# Patient Record
Sex: Male | Born: 1940 | Race: White | Hispanic: No | Marital: Married | Smoking: Never smoker
Health system: Southern US, Community
[De-identification: ages and names within clinical notes are randomized; demographics above are authoritative.]

## PROBLEM LIST (undated history)

## (undated) DIAGNOSIS — J302 Other seasonal allergic rhinitis: Secondary | ICD-10-CM

## (undated) DIAGNOSIS — K429 Umbilical hernia without obstruction or gangrene: Secondary | ICD-10-CM

## (undated) DIAGNOSIS — N433 Hydrocele, unspecified: Secondary | ICD-10-CM

## (undated) DIAGNOSIS — F32A Depression, unspecified: Secondary | ICD-10-CM

## (undated) DIAGNOSIS — F329 Major depressive disorder, single episode, unspecified: Secondary | ICD-10-CM

## (undated) DIAGNOSIS — R7303 Prediabetes: Secondary | ICD-10-CM

## (undated) HISTORY — DX: Major depressive disorder, single episode, unspecified: F32.9

## (undated) HISTORY — DX: Depression, unspecified: F32.A

## (undated) HISTORY — DX: Hydrocele, unspecified: N43.3

## (undated) HISTORY — PX: EYE SURGERY: SHX253

---

## 1997-10-18 ENCOUNTER — Ambulatory Visit (HOSPITAL_COMMUNITY): Admission: RE | Admit: 1997-10-18 | Discharge: 1997-10-18 | Payer: Self-pay | Admitting: Internal Medicine

## 1998-02-09 ENCOUNTER — Emergency Department (HOSPITAL_COMMUNITY): Admission: EM | Admit: 1998-02-09 | Discharge: 1998-02-09 | Payer: Self-pay | Admitting: Emergency Medicine

## 1998-02-10 ENCOUNTER — Ambulatory Visit (HOSPITAL_COMMUNITY): Admission: RE | Admit: 1998-02-10 | Discharge: 1998-02-10 | Payer: Self-pay | Admitting: Internal Medicine

## 1998-02-24 ENCOUNTER — Ambulatory Visit (HOSPITAL_COMMUNITY): Admission: RE | Admit: 1998-02-24 | Discharge: 1998-02-24 | Payer: Self-pay | Admitting: Internal Medicine

## 1998-02-24 ENCOUNTER — Encounter: Payer: Self-pay | Admitting: Internal Medicine

## 1998-06-05 ENCOUNTER — Ambulatory Visit: Admission: RE | Admit: 1998-06-05 | Discharge: 1998-06-05 | Payer: Self-pay | Admitting: *Deleted

## 1999-06-12 ENCOUNTER — Ambulatory Visit (HOSPITAL_COMMUNITY): Admission: RE | Admit: 1999-06-12 | Discharge: 1999-06-12 | Payer: Self-pay

## 2000-05-20 ENCOUNTER — Encounter: Payer: Self-pay | Admitting: Emergency Medicine

## 2000-05-20 ENCOUNTER — Inpatient Hospital Stay (HOSPITAL_COMMUNITY): Admission: EM | Admit: 2000-05-20 | Discharge: 2000-05-21 | Payer: Self-pay | Admitting: Emergency Medicine

## 2000-05-21 ENCOUNTER — Encounter: Payer: Self-pay | Admitting: Cardiovascular Disease

## 2002-03-05 ENCOUNTER — Encounter: Payer: Self-pay | Admitting: Emergency Medicine

## 2002-03-05 ENCOUNTER — Emergency Department (HOSPITAL_COMMUNITY): Admission: EM | Admit: 2002-03-05 | Discharge: 2002-03-05 | Payer: Self-pay | Admitting: Emergency Medicine

## 2002-08-26 ENCOUNTER — Encounter: Admission: RE | Admit: 2002-08-26 | Discharge: 2002-08-26 | Payer: Self-pay | Admitting: Internal Medicine

## 2002-08-26 ENCOUNTER — Encounter: Payer: Self-pay | Admitting: Internal Medicine

## 2003-04-21 ENCOUNTER — Observation Stay (HOSPITAL_COMMUNITY): Admission: EM | Admit: 2003-04-21 | Discharge: 2003-04-21 | Payer: Self-pay | Admitting: Emergency Medicine

## 2004-05-03 ENCOUNTER — Ambulatory Visit (HOSPITAL_BASED_OUTPATIENT_CLINIC_OR_DEPARTMENT_OTHER): Admission: RE | Admit: 2004-05-03 | Discharge: 2004-05-03 | Payer: Self-pay | Admitting: Orthopedic Surgery

## 2004-05-11 ENCOUNTER — Encounter: Admission: RE | Admit: 2004-05-11 | Discharge: 2004-07-21 | Payer: Self-pay | Admitting: Orthopedic Surgery

## 2005-11-04 ENCOUNTER — Encounter: Admission: RE | Admit: 2005-11-04 | Discharge: 2005-11-04 | Payer: Self-pay | Admitting: Orthopedic Surgery

## 2005-11-14 ENCOUNTER — Encounter: Admission: RE | Admit: 2005-11-14 | Discharge: 2005-11-14 | Payer: Self-pay | Admitting: Orthopedic Surgery

## 2005-12-19 ENCOUNTER — Encounter: Admission: RE | Admit: 2005-12-19 | Discharge: 2005-12-19 | Payer: Self-pay | Admitting: Orthopedic Surgery

## 2006-08-22 ENCOUNTER — Ambulatory Visit (HOSPITAL_COMMUNITY): Admission: RE | Admit: 2006-08-22 | Discharge: 2006-08-22 | Payer: Self-pay | Admitting: *Deleted

## 2006-08-22 ENCOUNTER — Ambulatory Visit: Payer: Self-pay | Admitting: Vascular Surgery

## 2006-08-22 ENCOUNTER — Encounter (INDEPENDENT_AMBULATORY_CARE_PROVIDER_SITE_OTHER): Payer: Self-pay | Admitting: Orthopedic Surgery

## 2008-06-03 ENCOUNTER — Ambulatory Visit: Payer: Self-pay | Admitting: Cardiovascular Disease

## 2008-06-10 ENCOUNTER — Ambulatory Visit: Payer: Self-pay

## 2008-07-09 ENCOUNTER — Ambulatory Visit: Payer: Self-pay

## 2008-07-09 ENCOUNTER — Encounter: Payer: Self-pay | Admitting: Cardiovascular Disease

## 2008-07-21 ENCOUNTER — Telehealth: Payer: Self-pay | Admitting: Cardiovascular Disease

## 2009-02-19 ENCOUNTER — Emergency Department (HOSPITAL_COMMUNITY): Admission: EM | Admit: 2009-02-19 | Discharge: 2009-02-20 | Payer: Self-pay | Admitting: Emergency Medicine

## 2009-04-08 ENCOUNTER — Encounter (INDEPENDENT_AMBULATORY_CARE_PROVIDER_SITE_OTHER): Payer: Self-pay | Admitting: *Deleted

## 2009-11-17 ENCOUNTER — Encounter
Admission: RE | Admit: 2009-11-17 | Discharge: 2009-11-17 | Payer: Self-pay | Source: Home / Self Care | Admitting: Orthopaedic Surgery

## 2010-04-16 ENCOUNTER — Encounter: Payer: Self-pay | Admitting: Cardiovascular Disease

## 2010-04-25 NOTE — Letter (Signed)
Summary: Referral - not able to see patient  [facility]  [address] [place], [place] [address]   Phone: [phone]  Fax: [phone]        April 08, 2009   [facility] [address] [place], [place] [address]   Re:   John [name] DOB:  14-Mar-1941 MRN:   [id number]    Dear Dr. Robert Bellow, M.D.:  Thank you for your kind referral of the above patient.  We have attempted to schedule the recommended procedure for a Screening Colonoscopy but have not been able to schedule because:    X  The patient was not available by phone and/or has not returned our calls.  ___ The patient declined to schedule the procedure at this time.  We appreciate the referral and hope that we will have the opportunity to treat this patient in the future.    Sincerely,  [facility] [phone]

## 2010-06-28 LAB — URINALYSIS, ROUTINE W REFLEX MICROSCOPIC
Glucose, UA: 250 mg/dL — AB
Hgb urine dipstick: NEGATIVE
Ketones, ur: NEGATIVE mg/dL
Nitrite: NEGATIVE
Protein, ur: NEGATIVE mg/dL
Specific Gravity, Urine: 1.038 — ABNORMAL HIGH (ref 1.005–1.030)
Urobilinogen, UA: 0.2 mg/dL (ref 0.0–1.0)
pH: 5 (ref 5.0–8.0)

## 2010-06-28 LAB — URINE CULTURE
Colony Count: NO GROWTH
Culture: NO GROWTH

## 2010-08-08 NOTE — Assessment & Plan Note (Signed)
[facility]                            CARDIOLOGY OFFICE NOTE   John [name], John [name]                        MRN:          [id number]  DATE:06/03/2008                            DOB:          04/04/1940    A 70 year old patient previously seen here 10 years ago for atypical  pain, had a normal heart cath.  The patient has been having recurrent  chest pain.  It is atypical.  It is in the center of his chest.  It can  radiate to his neck and down his arms.  It is not necessarily  exertional.  Frequently, it is worse after sleeping at night.  He also  has a component which sounds more like carpal tunnel syndrome with pain  and tingling in his hands.  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  His  coronary risk factors include positive family history.  He is a  nonsmoker.  He is not on any medicine for hypertension or diabetes or  cholesterol.  He sees Dr. Clarene Duke for his primary care needs at [facility]   His pain has been progressive over the last 6 weeks.  He is under a lot  of stress in regards to his business.  He cannot walk very well.  He has  had 3 surgeries on his knee.   PAST MEDICAL HISTORY:  Otherwise remarkable for hiatal hernia, normal  heart cath back in 1997, which was performed by myself, history of  reflux.   ALLERGIES:  He is allergic to PENICILLIN.   He is taking an aspirin a day.   He is happily married.  He has 2 older children.  He owns a Medical sales representative.  He is otherwise sedentary.  He does not smoke or drink.   FAMILY HISTORY:  Noncontributory.   PHYSICAL EXAMINATION:  GENERAL:  Remarkable for an overweight male in no  distress.  VITAL SIGNS:  Blood pressure 140/70, pulse 78 and regular, weight 248.  HEENT:  Unremarkable.  NECK:  Carotids are normal without bruit.  No lymphadenopathy,  thyromegaly, or JVP elevation.  LUNGS:  Clear.  Good diaphragmatic motion.  No wheezing.  CARDIAC:  S1, S2.  Normal heart sounds.  PMI  normal.  ABDOMEN:  Benign.  Bowel sounds positive.  No AAA, no tenderness, no  bruit, no hepatosplenomegaly, no hepatojugular reflux, or tenderness.  EXTREMITIES:  Distal pulses are intact.  No edema.  NEURO:  Nonfocal.  SKIN:  Warm and dry.  MUSCULOSKELETAL:  No muscular weakness.  Radial pulses are good.  He  gets some tingling in his hands with a positive Tinel sign only on the  right.  No pain on abduction and multiple upper extremity arm movements.   EKG shows sinus rhythm with first-degree heart block, slightly poor R-  wave progression, nonspecific ST-T-wave changes in leads III and F.   IMPRESSION:  1. Chest pain with nonspecific EKG changes and first-degree heart      block, unable to exercise due to right knee pain.  Followup      adenosine  Myoview.  2. Possible carpal tunnel syndrome.  Encourage the patient to follow      up with Dr. Clarene Duke in regards to possible median nerve testing.      His pain may also be emanating from his neck and he may need a      cervical spine.   I will see him as needed so long as his Myoview is normal.     Theron Arista C. Eden Emms, MD, [facility]  Electronically Signed    PCN/MedQ  DD: 06/03/2008  DT: 06/04/2008  Job #: [id number]

## 2010-08-11 NOTE — H&P (Signed)
NAME:  John [name], John [name]                           ACCOUNT NO.:  [id number]   MEDICAL RECORD NO.:  [id number]                   PATIENT TYPE:  EMS   LOCATION:  MAJO                                 FACILITY:  [facility]   PHYSICIAN:  Duke Salvia, M.D.               DATE OF BIRTH:  August 30, 1940   DATE OF ADMISSION:  04/20/2003  DATE OF DISCHARGE:                                HISTORY & PHYSICAL   John [name] presents to the emergency room with complaints of chest pain.   He is a 70 year old gentleman with a history of chest pain in the past, for  which he had a right catheterization in 1997.  That was negative and he had  a Cardiolite in 2002 or so; that was also negative.  Over the last three  days he has had recurrent episodes of substernal chest discomfort that is  quite distinct from his previous stabbing pains. He describes this as knife-  like with radiation into the neck and into the proximal portion of the left  arm.  It is not associated with nausea or shortness of breath.  There may be  some associated diaphoresis.  He thinks that it lasts minutes.  It is  unrelated to exertion. It seems to stop spontaneously.  He also has noted in  the same time frame a brackish taste in the back of his mouth and is awaken  in the morning with a similar bad taste over the last week or so.   He does not have a history of a hiatal hernia or a known reflux disease. He  does state that his stools have been dark and more malodorous of late.   His cardiac risk factors are notable for dyslipidemia but no family history  of diabetes, hypertension or cigarettes.   He does have a history of sleep apnea.   PAST MEDICAL HISTORY:  Otherwise negative.   PAST SURGICAL HISTORY:  Negative.   SOCIAL HISTORY:  He is married. He has a son. He works as a Nutritional therapist. He does  not use cigarettes, alcohol or recreational drugs.   REVIEW OF SYSTEMS:  Notable for night sweats but this is chronic. It is  unassociated  with changes in weight.  HEENT REVIEW:  Negative.  MUSCULOSKELETAL:  Some arthritis. RESPIRATORY:  Negative.  GI:  Negative.  ENDOCRINE:  Negative. GU: There is some nocturia as well as urgency.   MEDICATIONS:  Include only daily aspirin.   ALLERGIES:  He is allergic to penicillin.   PHYSICAL EXAMINATION:  GENERAL:  John [name] is an older Caucasian male in no  acute distress.  VITAL SIGNS:  Blood pressure 126/74.  Pulse was 76.  HEENT:  No icterus, no xanthomas.  NECK: The neck veins were flat. The carotids were brisk and full  bilaterally. There were no bruits.  BACK: Without kyphosis or scoliosis.  LUNGS:  Clear.  HEART:  The heart sounds were regular without murmurs or gallops.  ABDOMEN:  There is mild epigastric tenderness. This is the same from  previous symptoms.  Abdomen was soft without active bowel sounds without  midline pulsation.  Femoral pulses were 2+ and distal pulses were intact.  EXTREMITIES: There was no clubbing, cyanosis or edema.  NEURO: Grossly normal.  RECTAL: Negative.  SKIN: Warm and dry.   Electrocardiogram was signed by Dr. Freida Busman and is missing and repeat is  pending at this time.   IMPRESSION:  1. Recurrent chest pain of three days duration with typical and atypical     features.  2. Cardiolite that was negative in 2002, with catheterization in 1997.  3. Cardiac risk factors notable for hyperlipidemia, but otherwise negative.  4. Obstructive sleep apnea.  5. Prostatism.   John [name] has recurrent chest pain with typical  and atypical features. His  profile is relatively low; the issue that is going to be difficult, is that  he is having recurrent hospitalizations for this and may well be that  catheterization as a definitive diagnostic tool, might be of some benefit.   I will defer this Drs. Timothy Lasso and Roscoe.   PLAN:  1. Will admit and rule out myocardial infarction.  2. Heparin overnight.  3. Proton pump inhibitor.  4. Fasting lipid profile.   5. May benefit from low risk cath if his enzymes are negative for definitive     diagnosis.                                                Duke Salvia, M.D.    SCK/MEDQ  D:  04/21/2003  T:  04/21/2003  Job:  [id number]

## 2010-08-11 NOTE — Discharge Summary (Signed)
NAMEEDRIK, RUNDLE                           ACCOUNT NO.:  [id number]   MEDICAL RECORD NO.:  [id number]                   PATIENT TYPE:  INP   LOCATION:  6532                                 FACILITY:  [facility]   PHYSICIAN:  Charlton Haws, M.D.                  DATE OF BIRTH:  1940/06/06   DATE OF ADMISSION:  04/20/2003  DATE OF DISCHARGE:  04/21/2003                           DISCHARGE SUMMARY - REFERRING   SUMMARY OF HISTORY:  Mr. Kaufhold is a 70 year old gentleman who presented to  the emergency room with reoccurring episodes of chest discomfort for the  preceding three days.  He feels it very distinct from his prior discomfort  of stabbing pain.  He does describe this discomfort as a knife-like  sensation with radiation into the neck and the proximal portion of the left  arm.  This is not associated with nausea or shortness of breath.  He thinks  there may be some diaphoresis that lasts only minutes and is unrelated to  exertion and resolves spontaneously.  He also noticed some water brash at  the time of discomfort and has had this sensation in the morning when he  awakens in the last week or so.  He has a history of right catheterization  in 1997 and a negative Cardiolite in approximately 2002.  He has a history  of hyperlipidemia.   LABORATORY DATA:  CK, MB and troponin were negative for myocardial  infarction.  H&H was 14.6 and 43.8, normal indices.  Platelets 173, WBCs  5.7.  Sodium 137, potassium 4, BUN 11, creatinine 0.7, glucose 82.  At the  time of this dictation lipid panel was pending.   EKG showed normal sinus rhythm, first degree AV block.   HOSPITAL COURSE:  Mr. Bacha was admitted to 6500 by Dr. Duke Salvia.  Dr. Graciela Husbands did not feel his discomfort was related to cardiac etiology.  He  felt it was more gastrointestinal in nature and started the patient on  Protonix and continued his coated aspirin.  He noted on admission he will  defer outpatient Cardiolite to Dr.  Charlton Haws.  Dr. Graciela Husbands notified Dr.  Gwen Pounds of admission.  However, Dr. Timothy Lasso, on reviewing the patient's  charts at the office, stated that the patient was released from his practice  for medical noncompliance and multiple no-shows.  He feels that he needs to  find a new primary care physician and finds the EKGs were negative for  myocardial infarction, thus Dr. [place] Bing felt that he could be  discharged home with gastrointestinal management and follow-up in the  office.  Dr. Dietrich Pates stated that at the time of follow-up outpatient  Cardiolite should be arranged.   DISCHARGE DIAGNOSES:  1. Atypical chest discomfort, probably gastrointestinal in etiology.  2. History of medical noncompliance as well as noncompliance for     appointments.  3. History of  hyperlipidemia, not currently on medications.   DISPOSITION:  Mr. Walsh is discharged home .  He is given a prescription for  Protonix 40 mg q.d.  He is given a one months supply with one refill in  hopes that he will obtain a primary care physician and be compliant with his  medications and follow-up.  He was also asked to continue a coated aspirin  325 mg q. day.  His activity was not restricted.  He was asked to maintain a  low salt, fat and cholesterol diet and asked to obtain a primary care  physician.  He will see Dr. Reginia Forts PA on February 9 at 12 o'clock  for follow-up.  If the patient appears for  that appointment a consideration of an outpatient stress Cardiolite should  be performed if the Protonix has not helped alleviate his symptoms.  Also at  the time of follow-up, review of the hospital fasting lipid profile should  be reviewed since it is still pending at the time of this dictation.      Joellyn Rued, P.A. [facility]                    Charlton Haws, M.D.    EW/MEDQ  D:  04/21/2003  T:  04/21/2003  Job:  [id number]   cc:   Charlton Haws, M.D.   Gwen Pounds, M.D.  [address]  [place]  [place]  [address]  Fax: [phone]

## 2010-08-11 NOTE — Discharge Summary (Signed)
[facility]  Patient:    John [name], John [name]                        MRN: [id number] Adm. Date:  [id number] Attending:  Colon Branch Dictator:   Joellyn Rued, P.A.C. CC:         Erskine Speed, M.D.   Referring Physician Discharge [facility]  DATE OF BIRTH:  [birth year]  SUMMARY OF HISTORY:  Mr. [name] is a 70 year old who developed stabbing left-sided chest discomfort around 4:30 on the day of admission.  He also noticed some left arm numbness.  He denied any associated nausea, vomiting, or diaphoresis.  The symptoms seemed to be worse with movement and lasted approximately 30 minutes.  He called our office and they told him to go to the emergency room.  Cardiac catheterization in 1997 was reportedly okay by the patient.  His medical history is unremarkable.  LABORATORY DATA:  CKs and troponins were negative for myocardial infarction. The sodium was 138, potassium 3.9, BUN 16, creatinine 0.6, and glucose 107. The ALT was slightly elevated at 54.  The hemoglobin was 15.0, hematocrit 43.8, normal indices, platelets 187, and WBC 5.2.  PT 12.5, PTT 25.  The EKG showed normal sinus rhythm, first degree AV block, and nonspecific ST-T wave changes.  HOSPITAL COURSE:  Overnight he did not have any further symptoms.  There was noted to be a mild increase in SGOT, however, his enzymes and troponins were negative for myocardial infarction.  A Persantine Cardiolite was performed on May 21, 2000.  During the procedure, his heart rate did increase to approximately 100 and he experienced burning in his chest unassociated with EKG changes.  Imaging showed an EF of 47% and no signs of ischemia or scar. There was mild septal hypokinesis.  It was felt that he could be discharged home.  DISCHARGE DIAGNOSIS:  Chest discomfort of unknown etiology.  DISPOSITION:  He is discharged home.  DISCHARGE MEDICATIONS:  He is asked to continue his aspirin 325 mg  q.d.  FOLLOW-UP:  He will follow up with Dr. Theron Arista C. Nishans P.A. in the office and asked to follow up with Erskine Speed, M.D.  Note that he should have fasting lipids done to further assess his risk for coronary artery disease. DD:  05/21/00 TD:  05/21/00 Job: [id number]

## 2010-08-11 NOTE — Op Note (Signed)
[name], [name]                 ACCOUNT NO.:  [id number]   MEDICAL RECORD NO.:  [id number]          PATIENT TYPE:  AMB   LOCATION:  DSC                          FACILITY:  [facility]   PHYSICIAN:  Mila Homer. Sherlean Foot, M.D. DATE OF BIRTH:  May 08, 1940   DATE OF PROCEDURE:  05/03/2004  DATE OF DISCHARGE:                                 OPERATIVE REPORT   SURGEON:  Mila Homer. Sherlean Foot, M.D.   ASSISTANT:  None.   PREOPERATIVE DIAGNOSIS:  Right knee medial meniscus tear.   POSTOPERATIVE DIAGNOSIS:  Right knee medial meniscus tear and lateral  meniscus tear.   PROCEDURES:  Right knee arthroscopy with partial medial and partial lateral  meniscectomy.   INDICATIONS FOR PROCEDURE:  The patient is a 70 year old status post an  injury and with MRI evidence of meniscus tearing.  Informed consent was  obtained.   DESCRIPTION OF PROCEDURE:  The patient was laid supine and administered  general anesthesia.  The right lower extremity was prepped and draped in the  usual sterile fashion.  Inferolateral and inferomedial portals were created  with a #11 blade, blunt trocar and cannula.  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  This was debrided with a small Automatic [employer] shaver.  Once the chondroplasty was complete here, the medial compartment was  evaluated.  There was a large posterior horn medial meniscus tear.  Straight  basket forceps, Great White shaver and debridement wand from [employer] to  perform a partial medial meniscectomy.  I then went into the notch.  The ACL  did have some partial tearing from the anterior fibers and some of the  medial fibers.  These were debrided back with the Arthrex capture wand, but  no effort was made to shrink the substance of the ACL.  I then went into the  __________ position.  There was some posterior horn and anterior horn  lateral meniscus tearing as well.  Straight basket  forceps and a Great White  shaver were used to perform a partial lateral meniscectomy.  I then lavaged  the knee and took one further tour to ensure all loose bodies were debrided.  I then evacuated the knee with fluid and instruments.  Closed with  interrupted 4-0 nylon sutures.  Dressed with Xeroform dressing, sponges,  sterile Webril and an Ace wrap.   COMPLICATIONS:  None.   DRAINS:  None.      SDL/MEDQ  D:  05/03/2004  T:  05/03/2004  Job:  [id number]

## 2010-09-29 ENCOUNTER — Emergency Department (HOSPITAL_COMMUNITY)
Admission: EM | Admit: 2010-09-29 | Discharge: 2010-09-29 | Disposition: A | Discharge status: Home / Self Care | Payer: Self-pay | Attending: Emergency Medicine | Admitting: Emergency Medicine

## 2010-09-29 DIAGNOSIS — K439 Ventral hernia without obstruction or gangrene: Secondary | ICD-10-CM | POA: Insufficient documentation

## 2010-09-29 DIAGNOSIS — Z79899 Other long term (current) drug therapy: Secondary | ICD-10-CM | POA: Insufficient documentation

## 2010-09-29 DIAGNOSIS — R1013 Epigastric pain: Secondary | ICD-10-CM | POA: Insufficient documentation

## 2010-10-19 ENCOUNTER — Encounter (INDEPENDENT_AMBULATORY_CARE_PROVIDER_SITE_OTHER): Payer: Self-pay | Admitting: General Surgery

## 2010-10-19 ENCOUNTER — Ambulatory Visit (INDEPENDENT_AMBULATORY_CARE_PROVIDER_SITE_OTHER): Payer: PRIVATE HEALTH INSURANCE | Admitting: General Surgery

## 2010-10-19 DIAGNOSIS — K429 Umbilical hernia without obstruction or gangrene: Secondary | ICD-10-CM | POA: Insufficient documentation

## 2010-10-19 DIAGNOSIS — N433 Hydrocele, unspecified: Secondary | ICD-10-CM | POA: Insufficient documentation

## 2010-10-19 HISTORY — DX: Hydrocele, unspecified: N43.3

## 2010-10-19 NOTE — Patient Instructions (Signed)
Refer  To Urology Dr. Crecencio Mc Patient will call to schedule umbilical hernia repair

## 2010-10-19 NOTE — Progress Notes (Signed)
Subjective:     Patient ID: John [name], male   DOB: 1940-05-13, 70 y.o.   MRN: [id number]  HPI We are asked to see the patient in consultation by Dr. Jamas Lav to evaluate him for a ventral hernia. The patient is a 70 year old white male who reached down to pick up heavy equipment work about 2 weeks ago and felt a tear at his bellybutton. He has had some discomfort at the area. He's had no fevers or chills. No chest pains or shortness of breath. No nausea or vomiting. His bowels move regularly.He works as a Surveyor, quantity at [employer].  Review of Systems  Constitutional: Negative.   HENT: Negative.   Eyes: Negative.   Respiratory: Negative.   Cardiovascular: Negative.   Gastrointestinal: Negative.   Genitourinary: Negative.   Musculoskeletal: Negative.   Skin: Negative.   Neurological: Negative.   Hematological: Negative.   Psychiatric/Behavioral: Negative.    Past Medical History  Diagnosis Date  . Cataract   . Hernia    Past Surgical History  Procedure Date  . Eye surgery    Current outpatient prescriptions:aspirin 81 MG tablet, Take 81 mg by mouth daily.  , Disp: , Rfl:   Allergies  Allergen Reactions  . Penicillins Rash      Objective:   Physical Exam  Constitutional: He is oriented to person, place, and time. He appears well-developed and well-nourished.  HENT:  Head: Normocephalic and atraumatic.  Eyes: Conjunctivae and EOM are normal. Pupils are equal, round, and reactive to light.  Neck: Normal range of motion. Neck supple.  Cardiovascular: Normal rate, regular rhythm and normal heart sounds.   Pulmonary/Chest: Effort normal and breath sounds normal.  Abdominal: Soft. Bowel sounds are normal.       He has a moderate umbilical hernia that is reducible. Mild tenderness associated with it. No evidence of obstruction.  Genitourinary:       Large area of swelling around the right testicle. No thickness to his right spermatic cord.  Musculoskeletal: Normal range  of motion.  Neurological: He is alert and oriented to person, place, and time.  Skin: Skin is warm and dry.  Psychiatric: He has a normal mood and affect. His behavior is normal.       Assessment:     Moderate-sized symptomatic umbilical hernia. Enlarging right hydrocele.    Plan:     Because of the risks of incarceration and strangulation I think he would benefit from having his umbilical hernia repaired. I have discussed with him in detail the risks and benefits of the surgery as well as some of the technical aspects and he understands and wishes to proceed. We will plan for this when it is convenient for her schedule. We will also plan to refer him to Dr. Crecencio Mc in urology to evaluate the enlargement of his right scrotum.

## 2010-12-08 ENCOUNTER — Other Ambulatory Visit (INDEPENDENT_AMBULATORY_CARE_PROVIDER_SITE_OTHER): Payer: Self-pay | Admitting: General Surgery

## 2010-12-08 ENCOUNTER — Encounter (HOSPITAL_COMMUNITY)
Admission: RE | Admit: 2010-12-08 | Discharge: 2010-12-08 | Disposition: A | Discharge status: Home / Self Care | Payer: Medicare Other | Source: Ambulatory Visit | Attending: General Surgery | Admitting: General Surgery

## 2010-12-08 DIAGNOSIS — K429 Umbilical hernia without obstruction or gangrene: Secondary | ICD-10-CM

## 2010-12-08 LAB — BASIC METABOLIC PANEL
CO2: 26 mEq/L (ref 19–32)
Chloride: 101 mEq/L (ref 96–112)
Creatinine, Ser: 0.79 mg/dL (ref 0.50–1.35)

## 2010-12-08 LAB — SURGICAL PCR SCREEN
MRSA, PCR: NEGATIVE
Staphylococcus aureus: NEGATIVE

## 2010-12-08 LAB — CBC
Hemoglobin: 14.4 g/dL (ref 13.0–17.0)
MCV: 88.2 fL (ref 78.0–100.0)
Platelets: 168 10*3/uL (ref 150–400)
RBC: 4.68 MIL/uL (ref 4.22–5.81)
WBC: 7.3 10*3/uL (ref 4.0–10.5)

## 2010-12-08 LAB — DIFFERENTIAL
Eosinophils Absolute: 0.5 10*3/uL (ref 0.0–0.7)
Lymphocytes Relative: 25 % (ref 12–46)
Lymphs Abs: 1.8 10*3/uL (ref 0.7–4.0)
Neutro Abs: 4 10*3/uL (ref 1.7–7.7)
Neutrophils Relative %: 55 % (ref 43–77)

## 2010-12-18 ENCOUNTER — Ambulatory Visit (HOSPITAL_COMMUNITY)
Admission: RE | Admit: 2010-12-18 | Discharge: 2010-12-18 | Disposition: A | Discharge status: Home / Self Care | Payer: Medicare Other | Source: Ambulatory Visit | Attending: General Surgery | Admitting: General Surgery

## 2010-12-18 DIAGNOSIS — K429 Umbilical hernia without obstruction or gangrene: Secondary | ICD-10-CM | POA: Insufficient documentation

## 2010-12-18 DIAGNOSIS — G4733 Obstructive sleep apnea (adult) (pediatric): Secondary | ICD-10-CM | POA: Insufficient documentation

## 2010-12-18 DIAGNOSIS — J40 Bronchitis, not specified as acute or chronic: Secondary | ICD-10-CM | POA: Insufficient documentation

## 2010-12-18 HISTORY — PX: UMBILICAL HERNIA REPAIR: SHX196

## 2010-12-20 ENCOUNTER — Telehealth (INDEPENDENT_AMBULATORY_CARE_PROVIDER_SITE_OTHER): Payer: Self-pay | Admitting: General Surgery

## 2010-12-20 NOTE — Telephone Encounter (Signed)
Pt called stating that he had hernia sx on 9/24 with dr.toth and that he hadn't had a BM since the Fri before 9/21.Marland Kitchenthe patient said that he did advise the nurses at the hospital when having surgery but they did not seem alarmed...the patient stated that he was taking stool softeners but that wasn't helping in which i told the pt that stool softeners just soften the stool not make you have a BM...i then instructed pt to definitely increase fluids and take MOM as directed on the back of the box..the patient did not want to do the MOM he wanted to get and enema because that would make him feel better.Marland Kitchenadvised pt that i would write this up and let the nurse and doctor know..the patient was pleased and would call back if any other problems arise.Marland Kitchen

## 2010-12-21 ENCOUNTER — Telehealth (INDEPENDENT_AMBULATORY_CARE_PROVIDER_SITE_OTHER): Payer: Self-pay | Admitting: General Surgery

## 2010-12-21 NOTE — Op Note (Signed)
  NAMEQUINCY, BOY                 ACCOUNT NO.:  [id number]  MEDICAL RECORD NO.:  [id number]  LOCATION:  SDSC                         FACILITY:  [facility]  PHYSICIAN:  Ollen Gross. Vernell Morgans, M.D. DATE OF BIRTH:  09/25/1940  DATE OF PROCEDURE:  12/18/2010 DATE OF DISCHARGE:                              OPERATIVE REPORT   PREOPERATIVE DIAGNOSIS:  Umbilical hernia.  POSTOPERATIVE DIAGNOSIS:  Umbilical hernia.  PROCEDURE:  Umbilical hernia repair with mesh.  SURGEON:  Ollen Gross. Vernell Morgans, MD  ANESTHESIA:  General endotracheal.  PROCEDURE IN DETAIL:  After informed consent was obtained, the patient was brought to the operating room and placed in supine position on the operating table.  After adequate induction of general anesthesia, the patient's abdomen was prepped with ChloraPrep, allowed to dry, and draped in usual sterile manner.  The area around the umbilicus was infiltrated with 0.25% Marcaine.  A small incision was made vertically through the umbilicus with a #15 blade knife.  This incision was carried down through the skin and subcutaneous tissue sharply with electrocautery.  The hernia sac was opened.  There were some omentum stuck within the hernia sac to the wall of the hernia.  This was taken down sharply with the electrocautery and then the omentum was able to be reduced without difficulty.  The hernia sac was also excised sharply with the electrocautery.  The fascial edges appeared to be healthy.  The defect was just about a centimeter in diameter.  The inside of the abdominal wall was palpated and no other hernias or abnormalities were appreciated.  Large circular umbilical hernia repair system was chosen. It was placed through the fascial defect into the abdominal cavity and then held in good close approximation to the anterior abdominal wall using the anchors.  The fascial defect was then closed incorporating part of the mesh along the midline with interrupted Novofil  stitches. Once this was accomplished, the hernia appeared to be well repaired and the mesh appeared to be in good position.  The wound was irrigated copious amounts of saline.  Some of the skin of the umbilicus did not appear to be healthy, so it was excised sharply with a #15 blade knife back to healthy skin.  The subcutaneous tissue was then closed with interrupted 2-0 Vicryl stitches and the skin was closed with interrupted 4-0 Monocryl subcuticular stitches.  Dermabond dressing was applied. The patient tolerated the procedure well.  At the end of the case, all needle, sponge, and instrument counts were correct.  The patient was then awakened and taken to recovery in stable condition.    Ollen Gross. Vernell Morgans, M.D.    PST/MEDQ  D:  12/18/2010  T:  12/18/2010  Job:  [id number]  Electronically Signed by Chevis Pretty III M.D. on 12/21/2010 09:50:08 AM

## 2010-12-21 NOTE — Telephone Encounter (Signed)
I CALLED MR Sloniker RE CONVERSATION HE HAD YESTERDAY 12-20-10 WITH TRIAGE Lawson Fiscal. MR Duhamel INDICATED HE WAS FEELING BETTER TODAY AND HAD RESULTS AFTER GIVING HIMSELF ENEMAS. HE ALSO NOTED HE HAD SEVERAL DROPS OF CLEAR DRAINAGE ON CLOTHING FROM INCISION AT UMBILICUS. NO FEVER OR REDNESS NOTED AT SITE. I ADVISED THAT HE CALL IF DRAINAGE INCREASES, REDNESS OR FEVER DEVELOP BEFORE HIS F/U APPOINTMENT.

## 2011-01-09 NOTE — Telephone Encounter (Signed)
Sounds ok

## 2011-01-11 ENCOUNTER — Encounter (INDEPENDENT_AMBULATORY_CARE_PROVIDER_SITE_OTHER): Payer: Self-pay | Admitting: General Surgery

## 2011-01-11 ENCOUNTER — Ambulatory Visit (INDEPENDENT_AMBULATORY_CARE_PROVIDER_SITE_OTHER): Payer: Medicare Other | Admitting: General Surgery

## 2011-01-11 VITALS — BP 140/76 | HR 72 | Temp 98.6°F | Resp 12 | Ht 74.0 in | Wt 240.4 lb

## 2011-01-11 DIAGNOSIS — K429 Umbilical hernia without obstruction or gangrene: Secondary | ICD-10-CM

## 2011-01-11 NOTE — Patient Instructions (Signed)
No strenuous activity for another 3 weeks 

## 2011-01-11 NOTE — Progress Notes (Signed)
Subjective:     Patient ID: John [name], male   DOB: 06/21/1940, 70 y.o.   MRN: [id number]  HPI The patient is a 70 year old white male who is now 69 weeks out from umbilical hernia repair with mesh. The first week he had a lot of soreness but since then he has gotten much better. He only has some mild tenderness now. He denies any fevers or chills. His appetite is good and his bowels are working normally.  Review of Systems     Objective:   Physical Exam On exam his abdomen was soft with minimal tenderness. His incision is healed nicely. He has no palpable evidence for recurrence of the hernia.    Assessment:     3 weeks postop from an umbilical hernia repair with mesh    Plan:     At this point I would like him to continue to refrain from strenuous activity. We will plan to see him back in about 3-4 weeks.

## 2011-02-09 ENCOUNTER — Encounter (INDEPENDENT_AMBULATORY_CARE_PROVIDER_SITE_OTHER): Payer: Self-pay | Admitting: General Surgery

## 2011-02-09 ENCOUNTER — Ambulatory Visit (INDEPENDENT_AMBULATORY_CARE_PROVIDER_SITE_OTHER): Payer: Medicare Other | Admitting: General Surgery

## 2011-02-09 VITALS — BP 148/88 | HR 72 | Temp 98.7°F | Resp 12 | Ht 74.0 in | Wt 244.2 lb

## 2011-02-09 DIAGNOSIS — K429 Umbilical hernia without obstruction or gangrene: Secondary | ICD-10-CM

## 2011-02-09 NOTE — Patient Instructions (Signed)
May return to all normal activities  Call Dr. Crecencio Mc in urology [phone]

## 2011-02-13 NOTE — Progress Notes (Signed)
Subjective:     Patient ID: John [name], male   DOB: 1940/05/30, 70 y.o.   MRN: [id number]  HPI The patient is a 70 year old white male who is now about 6 weeks out from an umbilical hernia repair with mesh. He feels great. He's not having any pain. He is moving around very comfortably. His appetite is good and his bowels are working normally.  Review of Systems     Objective:   Physical Exam On exam his abdomen is soft and nontender. His incision has completely healed. His abdominal wall feels very solid. There is no palpable evidence for recurrence of the hernia.    Assessment:     6 weeks out from an umbilical hernia repair with mesh    Plan:     At this point I think he can return of his normal activities without any restrictions. We will plan to see him back on a p.r.n. basis.

## 2011-09-30 ENCOUNTER — Emergency Department (HOSPITAL_COMMUNITY): Payer: Medicare Other

## 2011-09-30 ENCOUNTER — Ambulatory Visit (INDEPENDENT_AMBULATORY_CARE_PROVIDER_SITE_OTHER): Payer: Medicare Other | Admitting: Emergency Medicine

## 2011-09-30 ENCOUNTER — Encounter (HOSPITAL_COMMUNITY): Payer: Self-pay

## 2011-09-30 ENCOUNTER — Emergency Department (HOSPITAL_COMMUNITY)
Admission: EM | Admit: 2011-09-30 | Discharge: 2011-09-30 | Disposition: A | Discharge status: Home / Self Care | Payer: Medicare Other | Attending: Emergency Medicine | Admitting: Emergency Medicine

## 2011-09-30 VITALS — BP 122/72 | HR 68 | Temp 98.0°F | Resp 16 | Ht 74.0 in | Wt 250.0 lb

## 2011-09-30 DIAGNOSIS — R5383 Other fatigue: Secondary | ICD-10-CM | POA: Insufficient documentation

## 2011-09-30 DIAGNOSIS — R42 Dizziness and giddiness: Secondary | ICD-10-CM

## 2011-09-30 DIAGNOSIS — R079 Chest pain, unspecified: Secondary | ICD-10-CM

## 2011-09-30 DIAGNOSIS — R5381 Other malaise: Secondary | ICD-10-CM | POA: Insufficient documentation

## 2011-09-30 DIAGNOSIS — Z7982 Long term (current) use of aspirin: Secondary | ICD-10-CM | POA: Insufficient documentation

## 2011-09-30 DIAGNOSIS — R61 Generalized hyperhidrosis: Secondary | ICD-10-CM

## 2011-09-30 LAB — CBC WITH DIFFERENTIAL/PLATELET
Hemoglobin: 15.1 g/dL (ref 13.0–17.0)
Lymphs Abs: 1.6 10*3/uL (ref 0.7–4.0)
MCH: 31.1 pg (ref 26.0–34.0)
Monocytes Relative: 11 % (ref 3–12)
Neutro Abs: 3.3 10*3/uL (ref 1.7–7.7)
Neutrophils Relative %: 59 % (ref 43–77)
RBC: 4.85 MIL/uL (ref 4.22–5.81)

## 2011-09-30 LAB — COMPREHENSIVE METABOLIC PANEL
Alkaline Phosphatase: 54 U/L (ref 39–117)
BUN: 13 mg/dL (ref 6–23)
CO2: 24 mEq/L (ref 19–32)
Chloride: 102 mEq/L (ref 96–112)
GFR calc Af Amer: >90 mL/min (ref >90)
Glucose, Bld: 114 mg/dL — ABNORMAL HIGH (ref 70–99)
Potassium: 4.4 mEq/L (ref 3.5–5.1)
Total Bilirubin: 0.4 mg/dL (ref 0.3–1.2)

## 2011-09-30 LAB — CARDIAC PANEL(CRET KIN+CKTOT+MB+TROPI): Total CK: 76 U/L (ref 7–232)

## 2011-09-30 NOTE — ED Provider Notes (Signed)
History     CSN: [id number]  Arrival date & time 09/30/11  1240   First MD Initiated Contact with Patient 09/30/11 1317      Chief Complaint  Patient presents with  . Weakness    (Consider location/radiation/quality/duration/timing/severity/associated sxs/prior treatment) HPI Comments: Patient was working around the house yesterday.  When he finished, he felt tightness in the chest and left arm.  He tried taking tums, soda to help him belch but nothing seemed to work.  Went to bed and woke up this morning feeling weak, tired.  Denies fever or chills.  No cough.  No prior cardiac history.    Was seen at UC this morning, then sent here.    The history is provided by the patient.    Past Medical History  Diagnosis Date  . Cataract   . Hernia     umb hernia    Past Surgical History  Procedure Date  . Eye surgery   . Hernia repair 12/18/10    umbilical hernia repair with mesh    Family History  Problem Relation Age of Onset  . Heart disease Mother   . Alcohol abuse Mother   . Alcohol abuse Father     History  Substance Use Topics  . Smoking status: Never Smoker   . Smokeless tobacco: Not on file  . Alcohol Use: No      Review of Systems  All other systems reviewed and are negative.    Allergies  Penicillins  Home Medications   Current Outpatient Rx  Name Route Sig Dispense Refill  . ASPIRIN 81 MG PO TABS Oral Take 81 mg by mouth daily.      Marland Kitchen HYPROMELLOSE 2.5 % OP SOLN Both Eyes Place 1 drop into both eyes daily as needed. For dry eyes      BP 131/78  Pulse 65  Temp 98 F (36.7 C)  Resp 20  SpO2 98%  Physical Exam  Nursing note and vitals reviewed. Constitutional: He is oriented to person, place, and time. He appears well-developed and well-nourished. No distress.  HENT:  Head: Normocephalic.  Eyes: Pupils are equal, round, and reactive to light.  Neck: Normal range of motion. Neck supple.  Cardiovascular: Normal rate.   No murmur  heard. Pulmonary/Chest: Effort normal and breath sounds normal. No respiratory distress. He has no wheezes.  Abdominal: Soft. Bowel sounds are normal. He exhibits no distension. There is no tenderness.  Musculoskeletal: Normal range of motion.  Neurological: He is alert and oriented to person, place, and time.  Skin: Skin is warm and dry. He is not diaphoretic.    ED Course  Procedures (including critical care time)   Labs Reviewed  CBC WITH DIFFERENTIAL  COMPREHENSIVE METABOLIC PANEL  CARDIAC PANEL(CRET KIN+CKTOT+MB+TROPI)  APTT  PROTIME-INR   No results found.   No diagnosis found.   Date: 09/30/2011  Rate: 68  Rhythm: normal sinus rhythm  QRS Axis: normal  Intervals: normal  ST/T Wave abnormalities: normal  Conduction Disutrbances:none  Narrative Interpretation:   Old EKG Reviewed: unchanged    MDM  The patient presents with atypical symptoms for heart pain that occurred yesterday.  The workup is unremarkable.  I presented the patient with the option of CDU chest pain protocol, however he prefers to follow up with Dr. Estrella Myrtle who is his Cardiologist at [facility].  He assures me he will call tomorrow to schedule an appointment and return here if his symptoms worsen.  Geoffery Lyons, MD 09/30/11 [phone]

## 2011-09-30 NOTE — Progress Notes (Signed)
  Subjective:    Patient ID: John [name], male    DOB: April 05, 1940, 71 y.o.   MRN: [id number]  HPI 71 yr old CM brought back emergently with dizziness and diaphoresis. He had an episode of chest pressure and pain yesterday that started after working in his yard.  Also had L arm paresthesias and nausea with diaphoresis.  These symptoms lasted all evening.  He awakened today with diaphoresis, slight dizziness , and feeling overall weak.  He denies CP currently.  He has a h/o dyslipidemia; not on meds.  No h/o htn or DM.  Review of Systems  All other systems reviewed and are negative.       Objective:   Physical Exam  Nursing note and vitals reviewed. Constitutional: He is oriented to person, place, and time. He appears well-developed and well-nourished. No distress.  HENT:  Head: Normocephalic and atraumatic.  Neck: Normal range of motion. Neck supple. No JVD present.  Cardiovascular: Normal rate, regular rhythm and normal heart sounds.   Pulmonary/Chest: Effort normal and breath sounds normal.  Neurological: He is alert and oriented to person, place, and time.  Skin: He is diaphoretic.       Skin is clammy    EKG: WNL; no acute changes D/w Dr. Dareen Piano and Dr. Cleta Alberts.      Assessment & Plan:  ?unstable angina-no pain currently  81mg  X4 aspirin 11:45am 2L O2 initiated.  Called 911 for transport.  Attempted IV X1 without success.

## 2011-09-30 NOTE — ED Notes (Signed)
Pt reports working in his yard yesterday, developed (L) side chest pain and (L) arm numbness, tingling and pain that continued through out the night. Pt reports abd fullness, "feeling gassy," continued to belch through out the night followed by hiccups. Pt reports waking this am w/the need to have a BM, upon returning to bed pt became diaphoretic, dizzy, and "feeling faint." Pt reports it seems like everything "went black." Pt went to UC and was sent here for further evaluation.

## 2011-09-30 NOTE — ED Notes (Signed)
Pt reports generalized weakness and (L) arm tingling starting yesterday, pt denies (L) tingling, chest pain, sob, pt went to UC d/t dizziness this am, pt given ASA 324 mg at UC and placed 2 L [place]

## 2011-10-01 ENCOUNTER — Encounter: Payer: Self-pay | Admitting: Emergency Medicine

## 2011-10-31 ENCOUNTER — Encounter: Payer: Self-pay | Admitting: *Deleted

## 2011-11-01 ENCOUNTER — Ambulatory Visit: Payer: Medicare Other | Admitting: Cardiovascular Disease

## 2012-03-26 DIAGNOSIS — Z0271 Encounter for disability determination: Secondary | ICD-10-CM

## 2012-09-10 ENCOUNTER — Ambulatory Visit (INDEPENDENT_AMBULATORY_CARE_PROVIDER_SITE_OTHER): Payer: Medicare Other | Admitting: Family Medicine

## 2012-09-10 ENCOUNTER — Ambulatory Visit: Payer: Medicare Other

## 2012-09-10 VITALS — BP 152/80 | HR 93 | Temp 97.5°F | Resp 16 | Ht 72.0 in | Wt 250.0 lb

## 2012-09-10 DIAGNOSIS — S91331A Puncture wound without foreign body, right foot, initial encounter: Secondary | ICD-10-CM

## 2012-09-10 DIAGNOSIS — Z23 Encounter for immunization: Secondary | ICD-10-CM

## 2012-09-10 DIAGNOSIS — M79609 Pain in unspecified limb: Secondary | ICD-10-CM

## 2012-09-10 DIAGNOSIS — S91309A Unspecified open wound, unspecified foot, initial encounter: Secondary | ICD-10-CM

## 2012-09-10 DIAGNOSIS — R252 Cramp and spasm: Secondary | ICD-10-CM

## 2012-09-10 DIAGNOSIS — B353 Tinea pedis: Secondary | ICD-10-CM

## 2012-09-10 MED ORDER — CIPROFLOXACIN HCL 500 MG PO TABS: 500.0000 mg | ORAL_TABLET | Freq: Two times a day (BID) | ORAL | Status: DC

## 2012-09-10 MED ORDER — KETOCONAZOLE 2 % EX CREA: TOPICAL_CREAM | Freq: Two times a day (BID) | CUTANEOUS | Status: DC

## 2012-09-10 NOTE — Patient Instructions (Addendum)
1.  SOAK FOOT IN WARM WATER WITH PEROXIDE TWICE DAILY FOR THE NEXT THREE DAYS. 2.  KEEP WOUND CLEAN AND COVERED WITH BANDAID DURING THE DAY; REMOVE BANDAID AT BEDTIME. 3.  RETURN TO CLINIC FOR INCREASING FOOT PAIN, REDNESS, FEVER > 100.5, DRAINAGE FROM WOUND. 4.  APPLY CREAM TO ATHLETE'S FOOT (BETWEEN TOES(.

## 2012-09-10 NOTE — Progress Notes (Signed)
[address]   [place], [place]  [address]   [phone]  Subjective:    Patient ID: John [name], male    DOB: Aug 20, 1940, 72 y.o.   MRN: [id number]  HPI This 72 y.o. male presents for evaluation of wound R feet.  Stepped on nail R feet yesterday. Last Tetanus unknown.  Stepped on nail yesterday; unable to get shoe off; had to pull nail out of R foot.  Also stepped on nail on L foot.  Soaked foot in hot water with espom salt; also poured peroxide and alcohol on wound last night.  Very tender at puncture wound site.  No drainage. No fever/chills/sweats.  No drainage.  2. Leg cramps: onset last night B thighs.  Plummer and outside a lot.  Does not drink a lot of water.  Charlie horses last night.  No medications daily; takes vitamins only.  PCP:  Guest/[facility]  Review of Systems  Constitutional: Negative for fever, chills, diaphoresis and fatigue.  Skin: Positive for wound. Negative for color change, pallor and rash.    Past Medical History  Diagnosis Date  . Cataract   . Hernia     umb hernia  . Hydrocele, right 10/19/2010  . Allergy   . Depression     Past Surgical History  Procedure Laterality Date  . Eye surgery    . Hernia repair  12/18/10    umbilical hernia repair with mesh    Prior to Admission medications   Medication Sig Start Date End Date Taking? Authorizing Provider  aspirin 81 MG tablet Take 81 mg by mouth daily.     Yes Historical Provider, MD  hydroxypropyl methylcellulose (ISOPTO TEARS) 2.5 % ophthalmic solution Place 1 drop into both eyes daily as needed. For dry eyes   Yes Historical Provider, MD    Allergies  Allergen Reactions  . Penicillins Rash    History   Social History  . Marital Status: Married    Spouse Name: N/A    Number of Children: N/A  . Years of Education: N/A   Occupational History  . Not on file.   Social History Main Topics  . Smoking status: Never Smoker   . Smokeless tobacco: Not on file  . Alcohol Use: No  . Drug Use:  No  . Sexually Active: Not on file   Other Topics Concern  . Not on file   Social History Narrative  . No narrative on file    Family History  Problem Relation Age of Onset  . Heart disease Mother   . Alcohol abuse Mother   . Alcohol abuse Father        Objective:   Physical Exam  Nursing note and vitals reviewed. Constitutional: He is oriented to person, place, and time. He appears well-developed and well-nourished. No distress.  Neurological: He is alert and oriented to person, place, and time. No sensory deficit.  Skin: Skin is warm and dry. He is not diaphoretic.  R FOOT:  PUNCTURE WOUND X 3 MM; +TTP 1 CM SURROUNDING PUNCTURE WOUND; NO ERYTHEMA, SWELLING, INDURATION, OR DRAINAGE.  +ERYTHEMA INTERDIGIT SPACES DIFFUSELY. L FOOT: NO WOUND OR PUNCTURE SITE.  Psychiatric: He has a normal mood and affect. His behavior is normal.    TETANUS VACCINE ADMINISTERED BY Dellia Nims, LPN.  [facility] reading (PRIMARY) by  Dr. Katrinka Blazing.  R FOOT: NAD; no foreign body.      Assessment & Plan:  Puncture wound of foot, right, initial encounter - Plan:  DG Foot 2 Views Right, Td vaccine greater than or equal to 7yo preservative free IM, ciprofloxacin (CIPRO) 500 MG tablet  Pain, foot, right  Tinea pedis - Plan: ketoconazole (NIZORAL) 2 % cream   1. Pain foot R:  New.  Secondary to foreign body/puncture wound from nail.  Recommend Tylenol or Motrin PRN. 2.  Puncture wound R foot:  New. S/p Tetanus vaccine in office; recommend warm water soaks with peroxide bid for three days; keep wound clean and covered; RTC for fever/increasing pain/drainage/redness.  Remove bandage qhs. 3. Leg Cramps: New. Likely secondary to dehydration; recommend hydration aggressive over next several days. If persists, RTC for labs. 4. Tinea Pedis:  New.  Rx for Ketoconazole cream bid for two weeks.  Meds ordered this encounter  Medications  . ketoconazole (NIZORAL) 2 % cream    Sig: Apply topically 2 (two) times  daily. APPLY TO ATHLETE'S FOOT BETWEEN TOES.    Dispense:  30 g    Refill:  0  . ciprofloxacin (CIPRO) 500 MG tablet    Sig: Take 1 tablet (500 mg total) by mouth 2 (two) times daily.    Dispense:  14 tablet    Refill:  0

## 2012-09-26 ENCOUNTER — Ambulatory Visit (INDEPENDENT_AMBULATORY_CARE_PROVIDER_SITE_OTHER): Payer: Medicare Other | Admitting: Emergency Medicine

## 2012-09-26 ENCOUNTER — Ambulatory Visit: Payer: Medicare Other

## 2012-09-26 VITALS — BP 129/72 | HR 77 | Temp 98.0°F | Resp 17 | Ht 74.0 in | Wt 246.0 lb

## 2012-09-26 DIAGNOSIS — M25521 Pain in right elbow: Secondary | ICD-10-CM

## 2012-09-26 DIAGNOSIS — M25529 Pain in unspecified elbow: Secondary | ICD-10-CM

## 2012-09-26 MED ORDER — HYDROCODONE-ACETAMINOPHEN 5-325 MG PO TABS: 1.0000 | ORAL_TABLET | Freq: Four times a day (QID) | ORAL | Status: DC | PRN

## 2012-09-26 MED ORDER — MELOXICAM 15 MG PO TABS: 15.0000 mg | ORAL_TABLET | Freq: Every day | ORAL | Status: DC

## 2012-09-26 NOTE — Patient Instructions (Addendum)
I am concerned he may have torn the extensor muscles to your forearm. He need to treat the area with ice and use a sling take the medications for the next 3-4 days and if not improving call and we will get an appointment to see the orthopedist .

## 2012-09-26 NOTE — Progress Notes (Signed)
  Subjective:    Patient ID: John [name], male    DOB: [birth year], 72 y.o.   MRN: [id number]  HPI  72 YO male patient here today for complaint of swelling in his right arm. He complains of swelling and pain over his right elbow. He finds discomfort with driving, turning the key in his car and he was unable to shave with that arm this morning. He feels like it may be a tendonitis or a muscle sprain.  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 He used a Economist yesterday but no injury that he can recall.   Review of Systems     Objective:   Physical Exam neck is supple. Chest is clear. Carotids have no bruits. There is exquisite tenderness of the right lateral epicondyle. Patient has significant pain with extension against resistance. He has a diminished grip strength in the right hand due to pain. There are no other focal neurological signs. He does have swelling over the proximal forearm muscles  [facility] reading (PRIMARY) by  Dr.Daub there is a calcific density present over the lateral epicondyles otherwise films are normal.        Assessment & Plan:  I suspect the patient toward the extensor muscles to his forearm. Will treat with ice pain medications and anti-inflammatories

## 2012-10-31 ENCOUNTER — Emergency Department (HOSPITAL_COMMUNITY)
Admission: EM | Admit: 2012-10-31 | Discharge: 2012-10-31 | Disposition: A | Discharge status: Home / Self Care | Payer: BLUE CROSS/BLUE SHIELD | Source: Home / Self Care | Attending: Family Medicine | Admitting: Family Medicine

## 2012-10-31 ENCOUNTER — Encounter (HOSPITAL_COMMUNITY): Payer: Self-pay | Admitting: Emergency Medicine

## 2012-10-31 DIAGNOSIS — R42 Dizziness and giddiness: Secondary | ICD-10-CM

## 2012-10-31 MED ORDER — MECLIZINE HCL 25 MG PO TABS: 25.0000 mg | ORAL_TABLET | Freq: Four times a day (QID) | ORAL | Status: DC

## 2012-10-31 NOTE — ED Notes (Signed)
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 Denies syncope EMT was called... EKG normal... Orthostatic vitals normal Denies: SOB, CP... He is alert w/no signs of acute distress.

## 2012-10-31 NOTE — ED Provider Notes (Signed)
CSN: [id number]     Arrival date & time 10/31/12  1920 History     None    No chief complaint on file.  (Consider location/radiation/quality/duration/timing/severity/associated sxs/prior Treatment) Patient is a 72 y.o. male presenting with syncope. The history is provided by the patient. No language interpreter was used.  Loss of Consciousness Episode history:  Single Most recent episode:  2 days ago Relieved by:  Nothing Pt reports he was out in the ran last week and then on Wednesday he was working in the sun and when he crawled under a house he felt like he was going to black out.  Pt reports he did not completely black out.  EMS checked him and EKG was normal.  Pt reports he is still dizzy.  Pt thinks he has vertigo.  Pt reports he has felt like this in the past when he had "inner ear " problems.  Pt also complains of leg cramps for months and worsening memory.    Past Medical History  Diagnosis Date  . Cataract   . Hernia     umb hernia  . Hydrocele, right 10/19/2010  . Allergy   . Depression    Past Surgical History  Procedure Laterality Date  . Eye surgery    . Hernia repair  12/18/10    umbilical hernia repair with mesh   Family History  Problem Relation Age of Onset  . Heart disease Mother   . Alcohol abuse Mother   . Alcohol abuse Father    History  Substance Use Topics  . Smoking status: Never Smoker   . Smokeless tobacco: Not on file  . Alcohol Use: No    Review of Systems  Cardiovascular: Positive for syncope.  Neurological: Positive for light-headedness.  All other systems reviewed and are negative.    Allergies  Penicillins  Home Medications   Current Outpatient Rx  Name  Route  Sig  Dispense  Refill  . aspirin 81 MG tablet   Oral   Take 81 mg by mouth daily.           Marland Kitchen HYDROcodone-acetaminophen (NORCO) 5-325 MG per tablet   Oral   Take 1 tablet by mouth every 6 (six) hours as needed for pain.   20 tablet   0   . hydroxypropyl  methylcellulose (ISOPTO TEARS) 2.5 % ophthalmic solution   Both Eyes   Place 1 drop into both eyes daily as needed. For dry eyes         . ketoconazole (NIZORAL) 2 % cream   Topical   Apply topically 2 (two) times daily. APPLY TO ATHLETE'S FOOT BETWEEN TOES.   30 g   0   . meloxicam (MOBIC) 15 MG tablet   Oral   Take 1 tablet (15 mg total) by mouth daily.   30 tablet   1    There were no vitals taken for this visit. Physical Exam  Nursing note and vitals reviewed. Constitutional: He is oriented to person, place, and time. He appears well-developed and well-nourished.  HENT:  Head: Normocephalic and atraumatic.  Right Ear: External ear normal.  Left Ear: External ear normal.  Nose: Nose normal.  Mouth/Throat: Oropharynx is clear and moist.  Eyes: Conjunctivae and EOM are normal. Pupils are equal, round, and reactive to light.  Neck: Normal range of motion.  Cardiovascular: Normal rate and normal heart sounds.   Pulmonary/Chest: Effort normal and breath sounds normal.  Abdominal: Soft. Bowel sounds are normal.  Musculoskeletal:  Normal range of motion.  Neurological: He is alert and oriented to person, place, and time. He has normal reflexes. No cranial nerve deficit. Coordination normal.  Skin: Skin is warm.  Psychiatric: He has a normal mood and affect.    ED Course   Procedures (including critical care time)  Labs Reviewed - No data to display No results found. 1. Vertigo     MDM  I advised pt that I thought he should have evaluation in the ED for near syncope.   Pt reports he just wants medication for the dizziness.   I discussed possible serious causes for his symptoms.   Pt will see his MD on Monday.  He agrees to go to ED if symptoms worsen or change  Elson Areas, [place] 10/31/12 2016

## 2012-11-03 NOTE — ED Provider Notes (Signed)
Medical screening examination/treatment/procedure(s) were performed by resident physician or non-physician practitioner and as supervising physician I was immediately available for consultation/collaboration.   Barkley Bruns MD.   Linna Hoff, MD 11/03/12 [phone]

## 2013-05-01 ENCOUNTER — Ambulatory Visit: Payer: Medicare HMO

## 2013-05-01 ENCOUNTER — Ambulatory Visit (INDEPENDENT_AMBULATORY_CARE_PROVIDER_SITE_OTHER): Payer: Medicare HMO | Admitting: Internal Medicine

## 2013-05-01 VITALS — BP 126/70 | HR 75 | Temp 97.4°F | Resp 18 | Ht 73.5 in | Wt 246.0 lb

## 2013-05-01 DIAGNOSIS — Z23 Encounter for immunization: Secondary | ICD-10-CM

## 2013-05-01 DIAGNOSIS — Z719 Counseling, unspecified: Secondary | ICD-10-CM

## 2013-05-01 DIAGNOSIS — M2550 Pain in unspecified joint: Secondary | ICD-10-CM

## 2013-05-01 DIAGNOSIS — E119 Type 2 diabetes mellitus without complications: Secondary | ICD-10-CM

## 2013-05-01 DIAGNOSIS — R350 Frequency of micturition: Secondary | ICD-10-CM

## 2013-05-01 DIAGNOSIS — Z0000 Encounter for general adult medical examination without abnormal findings: Secondary | ICD-10-CM

## 2013-05-01 DIAGNOSIS — R81 Glycosuria: Secondary | ICD-10-CM

## 2013-05-01 DIAGNOSIS — R42 Dizziness and giddiness: Secondary | ICD-10-CM

## 2013-05-01 LAB — PSA, MEDICARE: PSA: 0.66 ng/mL (ref <=4.00)

## 2013-05-01 LAB — POCT CBC
Granulocyte percent: 60.8 %G (ref 37–80)
HEMATOCRIT: 47.4 % (ref 43.5–53.7)
HEMOGLOBIN: 15.3 g/dL (ref 14.1–18.1)
LYMPH, POC: 1.6 (ref 0.6–3.4)
MCH: 31 pg (ref 27–31.2)
MCHC: 32.3 g/dL (ref 31.8–35.4)
MCV: 96.1 fL (ref 80–97)
MID (cbc): 0.4 (ref 0–0.9)
MPV: 8.6 fL (ref 0–99.8)
POC GRANULOCYTE: 3.2 (ref 2–6.9)
POC LYMPH PERCENT: 30.6 %L (ref 10–50)
POC MID %: 8.6 %M (ref 0–12)
Platelet Count, POC: 147 10*3/uL (ref 142–424)
RBC: 4.93 M/uL (ref 4.69–6.13)
RDW, POC: 12.6 %
WBC: 5.2 10*3/uL (ref 4.6–10.2)

## 2013-05-01 LAB — POCT URINALYSIS DIPSTICK
BILIRUBIN UA: NEGATIVE
Ketones, UA: NEGATIVE
Leukocytes, UA: NEGATIVE
Nitrite, UA: NEGATIVE
Urobilinogen, UA: 0.2
pH, UA: 5

## 2013-05-01 LAB — POCT UA - MICROSCOPIC ONLY
Casts, Ur, LPF, POC: NEGATIVE
Crystals, Ur, HPF, POC: NEGATIVE
Epithelial cells, urine per micros: NEGATIVE
MUCUS UA: NEGATIVE
YEAST UA: NEGATIVE

## 2013-05-01 LAB — COMPLETE METABOLIC PANEL WITH GFR
ALBUMIN: 4.5 g/dL (ref 3.5–5.2)
ALT: 46 U/L (ref 0–53)
AST: 33 U/L (ref 0–37)
Alkaline Phosphatase: 58 U/L (ref 39–117)
BUN: 16 mg/dL (ref 6–23)
CALCIUM: 9.3 mg/dL (ref 8.4–10.5)
CHLORIDE: 103 meq/L (ref 96–112)
CO2: 23 meq/L (ref 19–32)
CREATININE: 0.81 mg/dL (ref 0.50–1.35)
GFR, EST NON AFRICAN AMERICAN: 89 mL/min
GLUCOSE: 167 mg/dL — AB (ref 70–99)
POTASSIUM: 4.6 meq/L (ref 3.5–5.3)
Sodium: 138 mEq/L (ref 135–145)
Total Bilirubin: 0.7 mg/dL (ref 0.2–1.2)
Total Protein: 7.4 g/dL (ref 6.0–8.3)

## 2013-05-01 LAB — LIPID PANEL
CHOLESTEROL: 201 mg/dL — AB (ref 0–200)
HDL: 37 mg/dL — ABNORMAL LOW (ref >39)
LDL Cholesterol: 110 mg/dL — ABNORMAL HIGH (ref 0–99)
TRIGLYCERIDES: 269 mg/dL — AB (ref <150)
Total CHOL/HDL Ratio: 5.4 Ratio
VLDL: 54 mg/dL — ABNORMAL HIGH (ref 0–40)

## 2013-05-01 LAB — GLUCOSE, POCT (MANUAL RESULT ENTRY): POC Glucose: 150 mg/dl — AB (ref 70–99)

## 2013-05-01 LAB — IFOBT (OCCULT BLOOD): IFOBT: NEGATIVE

## 2013-05-01 LAB — POCT GLYCOSYLATED HEMOGLOBIN (HGB A1C): Hemoglobin A1C: 8.1

## 2013-05-01 LAB — TSH: TSH: 1.383 u[IU]/mL (ref 0.350–4.500)

## 2013-05-01 MED ORDER — METFORMIN HCL 500 MG PO TABS: 500.0000 mg | ORAL_TABLET | Freq: Two times a day (BID) | ORAL | Status: DC

## 2013-05-01 NOTE — Progress Notes (Signed)
Subjective:    Patient ID: John [name], male    DOB: 01/19/1941, 73 y.o.   MRN: [id number]  HPI Patient here today for annual physical.Has not had a physical in 5 years. He complains of difficulty with urination. No other problems. Further hx he eats a lot of little Debbies pastry, he has dizzyness, pulyuria and polydipsia. Has a positive family hx of diabetes. Parents alive in their 2s.    Review of Systems  Constitutional: Positive for fatigue. Negative for fever, chills, diaphoresis, activity change, appetite change and unexpected weight change.  HENT: Positive for hearing loss. Negative for congestion, dental problem, drooling, ear discharge, ear pain, facial swelling, mouth sores, nosebleeds, postnasal drip, rhinorrhea and sinus pressure.   Eyes: Negative for photophobia, pain, discharge, redness, itching and visual disturbance.  Respiratory: Negative.  Negative for apnea, cough, choking, chest tightness, shortness of breath, wheezing and stridor.   Cardiovascular: Negative.  Negative for chest pain, palpitations and leg swelling.  Gastrointestinal: Negative.   Endocrine: Positive for polydipsia and polyuria. Negative for cold intolerance and heat intolerance.  Genitourinary: Positive for frequency, scrotal swelling and difficulty urinating. Negative for dysuria, urgency, hematuria, decreased urine volume, discharge, penile swelling, enuresis, genital sores, penile pain and testicular pain.  Allergic/Immunologic: Negative.  Negative for environmental allergies, food allergies and immunocompromised state.  Neurological: Negative.  Negative for dizziness, tremors, seizures, syncope, facial asymmetry, speech difficulty, weakness, light-headedness, numbness and headaches.  Hematological: Negative.  Negative for adenopathy. Does not bruise/bleed easily.  Psychiatric/Behavioral: Negative.  Negative for behavioral problems and agitation.       Objective:   Physical Exam  Vitals  reviewed. Constitutional: He is oriented to person, place, and time. He appears well-developed and well-nourished. No distress.  HENT:  Head: Normocephalic.  Right Ear: External ear normal.  Nose: Nose normal.  Mouth/Throat: Oropharynx is clear and moist.  Eyes: Conjunctivae and EOM are normal. Pupils are equal, round, and reactive to light.  Neck: Normal range of motion. Neck supple. No tracheal deviation present. No thyromegaly present.  Cardiovascular: Normal rate, regular rhythm, normal heart sounds and intact distal pulses.   Pulmonary/Chest: Effort normal.  Abdominal: Soft. Bowel sounds are normal. He exhibits no mass. There is no tenderness. Hernia confirmed negative in the right inguinal area and confirmed negative in the left inguinal area.  Genitourinary: Rectum normal, prostate normal and penis normal. Right testis shows mass and swelling. Right testis shows no tenderness. Left testis shows no mass, no swelling and no tenderness.  Musculoskeletal: Normal range of motion.  Lymphadenopathy:    He has no cervical adenopathy.       Right: No inguinal adenopathy present.       Left: No inguinal adenopathy present.  Neurological: He is alert and oriented to person, place, and time. He has normal reflexes. No cranial nerve deficit. He exhibits normal muscle tone. Coordination normal.  Skin: No rash noted.  Psychiatric: He has a normal mood and affect. His behavior is normal. Judgment and thought content normal.   Urology documented large hydrocoel in 2007, no record of colonoscopy  [facility] reading (PRIMARY) by  Dr.Guest cxr normal  Results for orders placed in visit on 05/01/13  POCT CBC      Result Value Range   WBC 5.2  4.6 - 10.2 K/uL   Lymph, poc 1.6  0.6 - 3.4   POC LYMPH PERCENT 30.6  10 - 50 %L   MID (cbc) 0.4  0 - 0.9   POC  MID % 8.6  0 - 12 %M   POC Granulocyte 3.2  2 - 6.9   Granulocyte percent 60.8  37 - 80 %G   RBC 4.93  4.69 - 6.13 M/uL   Hemoglobin 15.3  14.1 -  18.1 g/dL   HCT, POC 16.147.4  09.643.5 - 53.7 %   MCV 96.1  80 - 97 fL   MCH, POC 31.0  27 - 31.2 pg   MCHC 32.3  31.8 - 35.4 g/dL   RDW, POC 04.512.6     Platelet Count, POC 147  142 - 424 K/uL   MPV 8.6  0 - 99.8 fL  POCT UA - MICROSCOPIC ONLY      Result Value Range   WBC, Ur, HPF, POC 0-1     RBC, urine, microscopic 0-1     Bacteria, U Microscopic trace     Mucus, UA neg     Epithelial cells, urine per micros neg     Crystals, Ur, HPF, POC neg     Casts, Ur, LPF, POC neg     Yeast, UA neg    POCT URINALYSIS DIPSTICK      Result Value Range   Color, UA yellow     Clarity, UA clear     Glucose, UA >=1000     Bilirubin, UA neg     Ketones, UA neg     Spec Grav, UA >=1.030     Blood, UA tracelysed     pH, UA 5.0     Protein, UA trace     Urobilinogen, UA 0.2     Nitrite, UA neg     Leukocytes, UA Negative     .Glycosuria/A1c and glucose Results for orders placed in visit on 05/01/13  IFOBT (OCCULT BLOOD)      Result Value Range   IFOBT Negative    POCT CBC      Result Value Range   WBC 5.2  4.6 - 10.2 K/uL   Lymph, poc 1.6  0.6 - 3.4   POC LYMPH PERCENT 30.6  10 - 50 %L   MID (cbc) 0.4  0 - 0.9   POC MID % 8.6  0 - 12 %M   POC Granulocyte 3.2  2 - 6.9   Granulocyte percent 60.8  37 - 80 %G   RBC 4.93  4.69 - 6.13 M/uL   Hemoglobin 15.3  14.1 - 18.1 g/dL   HCT, POC 40.947.4  81.143.5 - 53.7 %   MCV 96.1  80 - 97 fL   MCH, POC 31.0  27 - 31.2 pg   MCHC 32.3  31.8 - 35.4 g/dL   RDW, POC 91.412.6     Platelet Count, POC 147  142 - 424 K/uL   MPV 8.6  0 - 99.8 fL  POCT UA - MICROSCOPIC ONLY      Result Value Range   WBC, Ur, HPF, POC 0-1     RBC, urine, microscopic 0-1     Bacteria, U Microscopic trace     Mucus, UA neg     Epithelial cells, urine per micros neg     Crystals, Ur, HPF, POC neg     Casts, Ur, LPF, POC neg     Yeast, UA neg    POCT URINALYSIS DIPSTICK      Result Value Range   Color, UA yellow     Clarity, UA clear     Glucose, UA >=1000     Bilirubin, UA  neg      Ketones, UA neg     Spec Grav, UA >=1.030     Blood, UA tracelysed     pH, UA 5.0     Protein, UA trace     Urobilinogen, UA 0.2     Nitrite, UA neg     Leukocytes, UA Negative    GLUCOSE, POCT (MANUAL RESULT ENTRY)      Result Value Range   POC Glucose 150 (*) 70 - 99 mg/dl  POCT GLYCOSYLATED HEMOGLOBIN (HGB A1C)      Result Value Range   Hemoglobin A1C 8.1            Assessment & Plan:  Flu and Pneumonia vaccine New Diabetes/Refer for diabetic traingin Start Diabetic diet/Metformin 500mg  bid Return 1 week for Glucose

## 2013-05-01 NOTE — Progress Notes (Signed)
   Subjective:    Patient ID: John [name], male    DOB: 04/10/1940, 72 y.o.   MRN: [id number]  HPI    Review of Systems     Objective:   Physical Exam        Assessment & Plan:   

## 2013-05-01 NOTE — Patient Instructions (Addendum)
Diabetes and Exercise Exercising regularly is important. It is not just about losing weight. It has many health benefits, such as:  Improving your overall fitness, flexibility, and endurance.  Increasing your bone density.  Helping with weight control.  Decreasing your body fat.  Increasing your muscle strength.  Reducing stress and tension.  Improving your overall health. People with diabetes who exercise gain additional benefits because exercise:  Reduces appetite.  Improves the body's use of blood sugar (glucose).  Helps lower or control blood glucose.  Decreases blood pressure.  Helps control blood lipids (such as cholesterol and triglycerides).  Improves the body's use of the hormone insulin by:  Increasing the body's insulin sensitivity.  Reducing the body's insulin needs.  Decreases the risk for heart disease because exercising:  Lowers cholesterol and triglycerides levels.  Increases the levels of good cholesterol (such as high-density lipoproteins [HDL]) in the body.  Lowers blood glucose levels. YOUR ACTIVITY PLAN  Choose an activity that you enjoy and set realistic goals. Your health care provider or diabetes educator can help you make an activity plan that works for you. You can break activities into 2 or 3 sessions throughout the day. Doing so is as good as one long session. Exercise ideas include:  Taking the dog for a walk.  Taking the stairs instead of the elevator.  Dancing to your favorite song.  Doing your favorite exercise with a friend. RECOMMENDATIONS FOR EXERCISING WITH TYPE 1 OR TYPE 2 DIABETES   Check your blood glucose before exercising. If blood glucose levels are greater than 240 mg/dL, check for urine ketones. Do not exercise if ketones are present.  Avoid injecting insulin into areas of the body that are going to be exercised. For example, avoid injecting insulin into:  The arms when playing tennis.  The legs when  jogging.  Keep a record of:  Food intake before and after you exercise.  Expected peak times of insulin action.  Blood glucose levels before and after you exercise.  The type and amount of exercise you have done.  Review your records with your health care provider. Your health care provider will help you to develop guidelines for adjusting food intake and insulin amounts before and after exercising.  If you take insulin or oral hypoglycemic agents, watch for signs and symptoms of hypoglycemia. They include:  Dizziness.  Shaking.  Sweating.  Chills.  Confusion.  Drink plenty of water while you exercise to prevent dehydration or heat stroke. Body water is lost during exercise and must be replaced.  Talk to your health care provider before starting an exercise program to make sure it is safe for you. Remember, almost any type of activity is better than none. Document Released: 06/02/2003 Document Revised: 11/12/2012 Document Reviewed: 08/19/2012 Exit[employer] Patient Information 2014 [employer]. Diabetes Meal Planning Guide The diabetes meal planning guide is a tool to help you plan your meals and snacks. It is important for people with diabetes to manage their blood glucose (sugar) levels. Choosing the right foods and the right amounts throughout your day will help control your blood glucose. Eating right can even help you improve your blood pressure and reach or maintain a healthy weight. CARBOHYDRATE COUNTING MADE EASY When you eat carbohydrates, they turn to sugar. This raises your blood glucose level. Counting carbohydrates can help you control this level so you feel better. When you plan your meals by counting carbohydrates, you can have more flexibility in what you eat and balance your medicine   with your food intake. Carbohydrate counting simply means adding up the total amount of carbohydrate grams in your meals and snacks. Try to eat about the same amount at each meal. Foods  with carbohydrates are listed below. Each portion below is 1 carbohydrate serving or 15 grams of carbohydrates. Ask your dietician how many grams of carbohydrates you should eat at each meal or snack. Grains and Starches  1 slice bread.   English muffin or hotdog/hamburger bun.   cup cold cereal (unsweetened).   cup cooked pasta or rice.   cup starchy vegetables (corn, potatoes, peas, beans, winter squash).  1 tortilla (6 inches).   bagel.  1 waffle or pancake (size of a CD).   cup cooked cereal.  4 to 6 small crackers. *Whole grain is recommended. Fruit  1 cup fresh unsweetened berries, melon, papaya, pineapple.  1 small fresh fruit.   banana or mango.   cup fruit juice (4 oz unsweetened).   cup canned fruit in natural juice or water.  2 tbs dried fruit.  12 to 15 grapes or cherries. Milk and Yogurt  1 cup fat-free or 1% milk.  1 cup soy milk.  6 oz light yogurt with sugar-free sweetener.  6 oz low-fat soy yogurt.  6 oz plain yogurt. Vegetables  1 cup raw or  cup cooked is counted as 0 carbohydrates or a "free" food.  If you eat 3 or more servings at 1 meal, count them as 1 carbohydrate serving. Other Carbohydrates   oz chips or pretzels.   cup ice cream or frozen yogurt.   cup sherbet or sorbet.  2 inch square cake, no frosting.  1 tbs honey, sugar, jam, jelly, or syrup.  2 small cookies.  3 squares of graham crackers.  3 cups popcorn.  6 crackers.  1 cup broth-based soup.  Count 1 cup casserole or other mixed foods as 2 carbohydrate servings.  Foods with less than 20 calories in a serving may be counted as 0 carbohydrates or a "free" food. You may want to purchase a book or computer software that lists the carbohydrate gram counts of different foods. In addition, the nutrition facts panel on the labels of the foods you eat are a good source of this information. The label will tell you how big the serving size is and the  total number of carbohydrate grams you will be eating per serving. Divide this number by 15 to obtain the number of carbohydrate servings in a portion. Remember, 1 carbohydrate serving equals 15 grams of carbohydrate. SERVING SIZES Measuring foods and serving sizes helps you make sure you are getting the right amount of food. The list below tells how big or small some common serving sizes are.  1 oz.........4 stacked dice.  3 oz.........Deck of cards.  1 tsp........Tip of little finger.  1 tbs........Thumb.  2 tbs........Golf ball.   cup.......Half of a fist.  1 cup........A fist. SAMPLE DIABETES MEAL PLAN Below is a sample meal plan that includes foods from the grain and starches, dairy, vegetable, fruit, and meat groups. A dietician can individualize a meal plan to fit your calorie needs and tell you the number of servings needed from each food group. However, controlling the total amount of carbohydrates in your meal or snack is more important than making sure you include all of the food groups at every meal. You may interchange carbohydrate containing foods (dairy, starches, and fruits). The meal plan below is an example of a 2000 calorie diet   using carbohydrate counting. This meal plan has 17 carbohydrate servings. Breakfast  1 cup oatmeal (2 carb servings).   cup light yogurt (1 carb serving).  1 cup blueberries (1 carb serving).   cup almonds. Snack  1 large apple (2 carb servings).  1 low-fat string cheese stick. Lunch  Chicken breast salad.  1 cup spinach.   cup chopped tomatoes.  2 oz chicken breast, sliced.  2 tbs low-fat Italian dressing.  12 whole-wheat crackers (2 carb servings).  12 to 15 grapes (1 carb serving).  1 cup low-fat milk (1 carb serving). Snack  1 cup carrots.   cup hummus (1 carb serving). Dinner  3 oz broiled salmon.  1 cup brown rice (3 carb servings). Snack  1  cups steamed broccoli (1 carb serving) drizzled with 1  tsp olive oil and lemon juice.  1 cup light pudding (2 carb servings). DIABETES MEAL PLANNING WORKSHEET Your dietician can use this worksheet to help you decide how many servings of foods and what types of foods are right for you.  BREAKFAST Food Group and Servings / Carb Servings Grain/Starches __________________________________ Dairy __________________________________________ Vegetable ______________________________________ Fruit ___________________________________________ Meat __________________________________________ Fat ____________________________________________ LUNCH Food Group and Servings / Carb Servings Grain/Starches ___________________________________ Dairy ___________________________________________ Fruit ____________________________________________ Meat ___________________________________________ Fat _____________________________________________ DINNER Food Group and Servings / Carb Servings Grain/Starches ___________________________________ Dairy ___________________________________________ Fruit ____________________________________________ Meat ___________________________________________ Fat _____________________________________________ SNACKS Food Group and Servings / Carb Servings Grain/Starches ___________________________________ Dairy ___________________________________________ Vegetable _______________________________________ Fruit ____________________________________________ Meat ___________________________________________ Fat _____________________________________________ DAILY TOTALS Starches _________________________ Vegetable ________________________ Fruit ____________________________ Dairy ____________________________ Meat ____________________________ Fat ______________________________ Document Released: 12/07/2004 Document Revised: 06/04/2011 Document Reviewed: 10/18/2008 ExitCare Patient Information 2014 [employer].  

## 2013-05-11 ENCOUNTER — Encounter: Payer: Self-pay | Admitting: *Deleted

## 2013-05-12 ENCOUNTER — Ambulatory Visit: Payer: Medicare HMO

## 2013-05-12 ENCOUNTER — Encounter: Payer: Self-pay | Admitting: Internal Medicine

## 2013-05-12 ENCOUNTER — Ambulatory Visit (INDEPENDENT_AMBULATORY_CARE_PROVIDER_SITE_OTHER): Payer: Medicare HMO | Admitting: Internal Medicine

## 2013-05-12 VITALS — BP 142/87 | HR 71 | Temp 97.8°F | Resp 16 | Ht 73.0 in | Wt 245.0 lb

## 2013-05-12 DIAGNOSIS — Z5181 Encounter for therapeutic drug level monitoring: Secondary | ICD-10-CM

## 2013-05-12 DIAGNOSIS — M79645 Pain in left finger(s): Secondary | ICD-10-CM

## 2013-05-12 DIAGNOSIS — IMO0001 Reserved for inherently not codable concepts without codable children: Secondary | ICD-10-CM

## 2013-05-12 DIAGNOSIS — E785 Hyperlipidemia, unspecified: Secondary | ICD-10-CM | POA: Insufficient documentation

## 2013-05-12 DIAGNOSIS — E1165 Type 2 diabetes mellitus with hyperglycemia: Secondary | ICD-10-CM

## 2013-05-12 DIAGNOSIS — M79609 Pain in unspecified limb: Secondary | ICD-10-CM

## 2013-05-12 DIAGNOSIS — IMO0002 Reserved for concepts with insufficient information to code with codable children: Secondary | ICD-10-CM

## 2013-05-12 DIAGNOSIS — E119 Type 2 diabetes mellitus without complications: Secondary | ICD-10-CM | POA: Insufficient documentation

## 2013-05-12 LAB — GLUCOSE, POCT (MANUAL RESULT ENTRY): POC Glucose: 99 mg/dl (ref 70–99)

## 2013-05-12 MED ORDER — ATORVASTATIN CALCIUM 10 MG PO TABS: 10.0000 mg | ORAL_TABLET | Freq: Every day | ORAL | Status: DC

## 2013-05-12 MED ORDER — MUPIROCIN 2 % EX OINT: 1.0000 "application " | TOPICAL_OINTMENT | Freq: Three times a day (TID) | CUTANEOUS | Status: DC

## 2013-05-12 MED ORDER — DOXYCYCLINE HYCLATE 100 MG PO TABS: 100.0000 mg | ORAL_TABLET | Freq: Two times a day (BID) | ORAL | Status: DC

## 2013-05-12 NOTE — Patient Instructions (Addendum)
Paronychia Paronychia is an inflammatory reaction involving the folds of the skin surrounding the fingernail. This is commonly caused by an infection in the skin around a nail. The most common cause of paronychia is frequent wetting of the hands (as seen with bartenders, food servers, nurses or others who wet their hands). This makes the skin around the fingernail susceptible to infection by bacteria (germs) or fungus. Other predisposing factors are:  Aggressive manicuring.  Nail biting.  Thumb sucking. The most common cause is a staphylococcal (a type of germ) infection, or a fungal (Candida) infection. When caused by a germ, it usually comes on suddenly with redness, swelling, pus and is often painful. It may get under the nail and form an abscess (collection of pus), or form an abscess around the nail. If the nail itself is infected with a fungus, the treatment is usually prolonged and may require oral medicine for up to one year. Your caregiver will determine the length of time treatment is required. The paronychia caused by bacteria (germs) may largely be avoided by not pulling on hangnails or picking at cuticles. When the infection occurs at the tips of the finger it is called felon. When the cause of paronychia is from the herpes simplex virus (HSV) it is called herpetic whitlow. TREATMENT  When an abscess is present treatment is often incision and drainage. This means that the abscess must be cut open so the pus can get out. When this is done, the following home care instructions should be followed. HOME CARE INSTRUCTIONS   It is important to keep the affected fingers very dry. Rubber or plastic gloves over cotton gloves should be used whenever the hand must be placed in water.  Keep wound clean, dry and dressed as suggested by your caregiver between warm soaks or warm compresses.  Soak in warm water for fifteen to twenty minutes three to four times per day for bacterial infections. Fungal  infections are very difficult to treat, so often require treatment for long periods of time.  For bacterial (germ) infections take antibiotics (medicine which kill germs) as directed and finish the prescription, even if the problem appears to be solved before the medicine is gone.  Only take over-the-counter or prescription medicines for pain, discomfort, or fever as directed by your caregiver. SEEK IMMEDIATE MEDICAL CARE IF:  You have redness, swelling, or increasing pain in the wound.  You notice pus coming from the wound.  You have a fever.  You notice a bad smell coming from the wound or dressing. Document Released: 09/05/2000 Document Revised: 06/04/2011 Document Reviewed: 05/07/2008 [facility] Patient Information 2014 [place], [place]. Cholesterol Cholesterol is a white, waxy, fat-like protein needed by your body in small amounts. The liver makes all the cholesterol you need. It is carried from the liver by the blood through the blood vessels. Deposits (plaque) may build up on blood vessel walls. This makes the arteries narrower and stiffer. Plaque increases the risk for heart attack and stroke. You cannot feel your cholesterol level even if it is very high. The only way to know is by a blood test to check your lipid (fats) levels. Once you know your cholesterol levels, you should keep a record of the test results. Work with your caregiver to to keep your levels in the desired range. WHAT THE RESULTS MEAN:  Total cholesterol is a rough measure of all the cholesterol in your blood.  LDL is the so-called bad cholesterol. This is the type that deposits cholesterol  in the walls of the arteries. You want this level to be low.  HDL is the good cholesterol because it cleans the arteries and carries the LDL away. You want this level to be high.  Triglycerides are fat that the body can either burn for energy or store. High levels are closely linked to heart disease. DESIRED LEVELS:  Total  cholesterol below 200.  LDL below 100 for people at risk, below 70 for very high risk.  HDL above 50 is good, above 60 is best.  Triglycerides below 150. HOW TO LOWER YOUR CHOLESTEROL:  Diet.  Choose fish or white meat chicken and [place], roasted or baked. Limit fatty cuts of red meat, fried foods, and processed meats, such as sausage and lunch meat.  Eat lots of fresh fruits and vegetables. Choose whole grains, beans, pasta, potatoes and cereals.  Use only small amounts of olive, corn or canola oils. Avoid butter, mayonnaise, shortening or palm kernel oils. Avoid foods with trans-fats.  Use skim/nonfat milk and low-fat/nonfat yogurt and cheeses. Avoid whole milk, cream, ice cream, egg yolks and cheeses. Healthy desserts include angel food cake, ginger snaps, animal crackers, hard candy, popsicles, and low-fat/nonfat frozen yogurt. Avoid pastries, cakes, pies and cookies.  Exercise.  A regular program helps decrease LDL and raises HDL.  Helps with weight control.  Do things that increase your activity level like gardening, walking, or taking the stairs.  Medication.  May be prescribed by your caregiver to help lowering cholesterol and the risk for heart disease.  You may need medicine even if your levels are normal if you have several risk factors. HOME CARE INSTRUCTIONS   Follow your diet and exercise programs as suggested by your caregiver.  Take medications as directed.  Have blood work done when your caregiver feels it is necessary. MAKE SURE YOU:   Understand these instructions.  Will watch your condition.  Will get help right away if you are not doing well or get worse. Document Released: 12/05/2000 Document Revised: 06/04/2011 Document Reviewed: 12/24/2012 [facility] Patient Information 2014 [place]

## 2013-05-12 NOTE — Progress Notes (Signed)
   Subjective:    Patient ID: John [name], male    DOB: August 30, 1940, 73 y.o.   MRN: [id number]  HPI pt presents with left thumb pain that started on Saturday. He had a hang nail that he pulled out and has since become painful, red and swollen.  Recently started Metformin 500 mg once daily for diabetes, will increase to bid and start atorvastatin for cholesterol. No fever, works as Nutritional therapistplumber.    Review of Systems     Objective:   Physical Exam  Vitals reviewed. Constitutional: He is oriented to person, place, and time. He appears well-developed and well-nourished. No distress.  HENT:  Head: Normocephalic.  Eyes: EOM are normal. No scleral icterus.  Neck: Normal range of motion.  Cardiovascular: Normal rate.   Pulmonary/Chest: Effort normal and breath sounds normal.  Musculoskeletal: Normal range of motion. He exhibits edema and tenderness.  Neurological: He is alert and oriented to person, place, and time. Coordination normal.  Skin: Lesion noted. There is erythema.     Psychiatric: He has a normal mood and affect.   Paronychia right thumb  Results for orders placed in visit on 05/12/13  GLUCOSE, POCT (MANUAL RESULT ENTRY)      Result Value Ref Range   POC Glucose 99  70 - 99 mg/dl   [facility] reading (PRIMARY) by  Dr Raelene BottGuest.no definite foreign body seen   Paronychia/NIDDM/Dyslipidemia    Assessment & Plan:  Doxy/Mupirocin Wound care  Start metformin bid and atorvastatin 10mg  qd

## 2013-05-12 NOTE — Progress Notes (Signed)
   Subjective:    Patient ID: John [name], male    DOB: 1941-02-12, 73 y.o.   MRN: [id number]  HPI    Review of Systems     Objective:   Physical Exam        Assessment & Plan:

## 2013-05-13 ENCOUNTER — Ambulatory Visit: Payer: BLUE CROSS/BLUE SHIELD

## 2013-05-20 ENCOUNTER — Ambulatory Visit: Payer: BLUE CROSS/BLUE SHIELD

## 2013-05-27 ENCOUNTER — Ambulatory Visit: Payer: BLUE CROSS/BLUE SHIELD

## 2013-06-01 ENCOUNTER — Telehealth: Payer: Self-pay | Admitting: Radiology

## 2013-06-01 MED ORDER — FREESTYLE SYSTEM KIT: 1.0000 | PACK | Status: DC | PRN

## 2013-06-01 NOTE — Telephone Encounter (Signed)
Pt's wife notified.

## 2013-06-01 NOTE — Telephone Encounter (Signed)
Rx for glucometer kit sent to pharmacy

## 2013-08-23 ENCOUNTER — Ambulatory Visit (INDEPENDENT_AMBULATORY_CARE_PROVIDER_SITE_OTHER): Payer: Medicare HMO | Admitting: Family Medicine

## 2013-08-23 ENCOUNTER — Ambulatory Visit: Payer: Medicare HMO

## 2013-08-23 VITALS — BP 110/72 | HR 77 | Temp 97.3°F | Ht 74.0 in | Wt 240.8 lb

## 2013-08-23 DIAGNOSIS — R059 Cough, unspecified: Secondary | ICD-10-CM

## 2013-08-23 DIAGNOSIS — J209 Acute bronchitis, unspecified: Secondary | ICD-10-CM

## 2013-08-23 DIAGNOSIS — R05 Cough: Secondary | ICD-10-CM

## 2013-08-23 DIAGNOSIS — R10812 Left upper quadrant abdominal tenderness: Secondary | ICD-10-CM

## 2013-08-23 MED ORDER — AZITHROMYCIN 250 MG PO TABS: ORAL_TABLET | ORAL | Status: DC

## 2013-08-23 MED ORDER — HYDROCOD POLST-CHLORPHEN POLST 10-8 MG/5ML PO LQCR: 5.0000 mL | Freq: Two times a day (BID) | ORAL | Status: DC | PRN

## 2013-08-23 NOTE — Progress Notes (Addendum)
Subjective:    Patient ID: John [name], male    DOB: 02-Feb-1941, 73 y.o.   MRN: [id number]  Cough   Chief Complaint  Patient presents with   Cough    yellow mucus x 1 week    This chart was scribed for Robyn Haber, MD by Thea Alken, ED Scribe. This patient was seen in room 10 and the patient's care was started at 11:30 AM.  HPI Comments: John [name] is a 73 y.o. male who presents to the [facility] complaining of constant productive cough consisting of yellow mucous onset 1 week with associated chest congestion. Pt reports he had rattling in his chest this morning. Pt has taken mucinex without relief to symptoms.  Pt reports pneumonia multiple time in the past.  Pt is a Development worker, community since 1962  Past Medical History  Diagnosis Date   Cataract    Hernia     umb hernia   Hydrocele, right 10/19/2010   Allergy    Depression    Allergies  Allergen Reactions   Penicillins Rash   Prior to Admission medications   Medication Sig Start Date End Date Taking? Authorizing Provider  aspirin 81 MG tablet Take 81 mg by mouth daily.     Yes Historical Provider, MD  atorvastatin (LIPITOR) 10 MG tablet Take 1 tablet (10 mg total) by mouth daily. 05/12/13  Yes Orma Flaming, MD  glucose monitoring kit (FREESTYLE) monitoring kit 1 each by Does not apply route as needed for other. 06/01/13  Yes Eleanore Kurtis Bushman, PA-C  HYDROcodone-acetaminophen (NORCO) 5-325 MG per tablet Take 1 tablet by mouth every 6 (six) hours as needed for pain. 09/26/12  Yes Darlyne Russian, MD  hydroxypropyl methylcellulose (ISOPTO TEARS) 2.5 % ophthalmic solution Place 1 drop into both eyes daily as needed. For dry eyes   Yes Historical Provider, MD  ketoconazole (NIZORAL) 2 % cream Apply topically 2 (two) times daily. APPLY TO ATHLETE'S FOOT BETWEEN TOES. 09/10/12  Yes Wardell Honour, MD  meclizine (ANTIVERT) 25 MG tablet Take 1 tablet (25 mg total) by mouth 4 (four) times daily. 10/31/12  Yes Hollace Kinnier  Sofia, PA-C  meloxicam (MOBIC) 15 MG tablet Take 1 tablet (15 mg total) by mouth daily. 09/26/12  Yes Darlyne Russian, MD  metFORMIN (GLUCOPHAGE) 500 MG tablet Take 1 tablet (500 mg total) by mouth 2 (two) times daily with a meal. 05/01/13  Yes Orma Flaming, MD  mupirocin ointment (BACTROBAN) 2 % Apply 1 application topically 3 (three) times daily. 05/12/13  Yes Orma Flaming, MD  doxycycline (VIBRA-TABS) 100 MG tablet Take 1 tablet (100 mg total) by mouth 2 (two) times daily. 05/12/13   Orma Flaming, MD   Review of Systems  HENT: Positive for congestion.   Respiratory: Positive for cough.     Objective:   Physical Exam  Nursing note and vitals reviewed. Constitutional: He is oriented to person, place, and time. He appears well-developed and well-nourished. No distress.  HENT:  Head: Normocephalic and atraumatic.  Right Ear: External ear normal.  Left Ear: External ear normal.  Eyes: EOM are normal.  Neck: Normal range of motion. Neck supple. No thyromegaly present.  Cardiovascular: Normal rate and regular rhythm.   Pulmonary/Chest: Effort normal. No respiratory distress. He has rales.  Bibasilar rales  Musculoskeletal: Normal range of motion.  Lymphadenopathy:    He has no cervical adenopathy.  Neurological: He is alert and oriented to person,  place, and time.  Skin: Skin is warm and dry.  Psychiatric: He has a normal mood and affect. His behavior is normal.   [facility] reading (PRIMARY) by  Dr. Joseph Art  chest x-ray shows hazy density in right lower lobe which may be a confluence of overlying shadows. There is some prominence of the bronchial tubes suggestive of bronchitis.      Assessment & Plan:   Acute bronchitis - Plan: azithromycin (ZITHROMAX Z-PAK) 250 MG tablet, chlorpheniramine-HYDROcodone (TUSSIONEX PENNKINETIC ER) 10-8 MG/5ML LQCR  Cough - Plan: DG Chest 2 View, azithromycin (ZITHROMAX Z-PAK) 250 MG tablet, chlorpheniramine-HYDROcodone (TUSSIONEX PENNKINETIC ER) 10-8 MG/5ML  LQCR  Signed, Robyn Haber, MD

## 2013-08-23 NOTE — Patient Instructions (Signed)

## 2013-09-28 ENCOUNTER — Telehealth: Payer: Self-pay

## 2013-09-28 NOTE — Telephone Encounter (Signed)
[employer] called. Wanted to let us know that this pt has not picked up his Lipitor since 05/12/13. Called pt to check on him because at that visit, Dr. Perrin MalteseGuest had wanted pt to recheck in 3 months. He says that he's been working really hard on diet/exercise and he will call back and make an appt to f/u

## 2013-11-17 DIAGNOSIS — Z0271 Encounter for disability determination: Secondary | ICD-10-CM

## 2013-12-15 ENCOUNTER — Telehealth: Payer: Self-pay | Admitting: *Deleted

## 2013-12-15 NOTE — Telephone Encounter (Signed)
Phoned and spoke with patient's wife regarding diabetic eye exam.  She was unaware (even though patient's diabetes has been under control) that he needed annual diabetic eye exams.  They are in the process of changing to a new eye doctor---one at [facility] in [place], but wife was not certain which provider.  Further stated she would be contacting them to schedule his diabetic eye exam.  Wife inquired regarding diabetic teaching, performed diabetic teaching with her-sxs, food choices, etc.  Am mailing her a Type 2 DM patient handout booklet.

## 2014-01-04 ENCOUNTER — Telehealth: Payer: Self-pay

## 2014-01-04 DIAGNOSIS — E119 Type 2 diabetes mellitus without complications: Secondary | ICD-10-CM

## 2014-01-04 NOTE — Telephone Encounter (Signed)
Wife is requesting a referral to the diabetic on [address] that Dr Perrin MalteseGuest sent over in the early part of this year.  He was unable to go due to out of town work.  He can now go.   [phone]  [phone]  Cell phone

## 2014-01-04 NOTE — Telephone Encounter (Signed)
Pt wants referral to nutritionist reentered.  I have pended. Please advise.

## 2014-01-05 NOTE — Telephone Encounter (Signed)
Please refer to nutrition

## 2014-01-06 NOTE — Telephone Encounter (Signed)
Referral signed.

## 2014-01-18 ENCOUNTER — Encounter: Payer: Self-pay | Admitting: Family Medicine

## 2014-01-18 ENCOUNTER — Ambulatory Visit (INDEPENDENT_AMBULATORY_CARE_PROVIDER_SITE_OTHER): Payer: Medicare HMO

## 2014-01-18 ENCOUNTER — Ambulatory Visit (INDEPENDENT_AMBULATORY_CARE_PROVIDER_SITE_OTHER): Payer: Medicare HMO | Admitting: Family Medicine

## 2014-01-18 VITALS — BP 122/70 | HR 68 | Temp 97.4°F | Resp 16 | Ht 73.5 in | Wt 234.4 lb

## 2014-01-18 DIAGNOSIS — M542 Cervicalgia: Secondary | ICD-10-CM

## 2014-01-18 DIAGNOSIS — E119 Type 2 diabetes mellitus without complications: Secondary | ICD-10-CM

## 2014-01-18 DIAGNOSIS — N400 Enlarged prostate without lower urinary tract symptoms: Secondary | ICD-10-CM

## 2014-01-18 DIAGNOSIS — N433 Hydrocele, unspecified: Secondary | ICD-10-CM

## 2014-01-18 DIAGNOSIS — E785 Hyperlipidemia, unspecified: Secondary | ICD-10-CM

## 2014-01-18 LAB — POCT URINALYSIS DIPSTICK
Bilirubin, UA: NEGATIVE
Blood, UA: NEGATIVE
Glucose, UA: NEGATIVE
Ketones, UA: NEGATIVE
Leukocytes, UA: NEGATIVE
Nitrite, UA: NEGATIVE
Protein, UA: NEGATIVE
Spec Grav, UA: 1.015
Urobilinogen, UA: 0.2
pH, UA: 5.5

## 2014-01-18 LAB — COMPLETE METABOLIC PANEL WITH GFR
ALT: 25 U/L (ref 0–53)
AST: 17 U/L (ref 0–37)
Albumin: 4.5 g/dL (ref 3.5–5.2)
Alkaline Phosphatase: 48 U/L (ref 39–117)
BUN: 16 mg/dL (ref 6–23)
CO2: 26 mEq/L (ref 19–32)
Calcium: 9.2 mg/dL (ref 8.4–10.5)
Chloride: 105 mEq/L (ref 96–112)
Creat: 0.76 mg/dL (ref 0.50–1.35)
GFR, Est African American: >89 mL/min
GFR, Est Non African American: >89 mL/min
Glucose, Bld: 122 mg/dL — ABNORMAL HIGH (ref 70–99)
Potassium: 4.8 mEq/L (ref 3.5–5.3)
Sodium: 139 mEq/L (ref 135–145)
Total Bilirubin: 0.4 mg/dL (ref 0.2–1.2)
Total Protein: 7.5 g/dL (ref 6.0–8.3)

## 2014-01-18 LAB — LIPID PANEL
Cholesterol: 152 mg/dL (ref 0–200)
HDL: 36 mg/dL — ABNORMAL LOW (ref >39)
LDL Cholesterol: 90 mg/dL (ref 0–99)
Total CHOL/HDL Ratio: 4.2 Ratio
Triglycerides: 131 mg/dL (ref <150)
VLDL: 26 mg/dL (ref 0–40)

## 2014-01-18 LAB — POCT GLYCOSYLATED HEMOGLOBIN (HGB A1C): Hemoglobin A1C: 5.9

## 2014-01-18 MED ORDER — TAMSULOSIN HCL 0.4 MG PO CAPS: 0.4000 mg | ORAL_CAPSULE | Freq: Every day | ORAL | Status: DC

## 2014-01-18 NOTE — Progress Notes (Addendum)
Patient ID: John [name], male   DOB: 03/23/1941, 73 y.o.   MRN: [id number]  This chart was scribed for John Haber, MD by Ladene Artist, ED Scribe. The patient was seen in room 28. Patient's care was started at 8:27 AM.  Patient ID: John [name] MRN: [id number], DOB: September 02, 1940, 73 y.o. Date of Encounter: 01/18/2014, 8:27 AM  Primary Physician: No PCP Per Patient  Chief Complaint  Patient presents with   bloodwork   Neck Pain    x 2 wks--radiates into shoulder    HPI: 73 y.o. year old male with history below presents for DM blood work. Pt states that he is only taking baby ASA at this time. He has stopped his diabetes and cholesterol medicines on his own.  Neck Pain Pt reports constant ccentral neck pain onset 2 weeks ago. Pt states that pain radiates into L shoulder. He reports a popping sensation in his neck with movement. Pt has tried Goody's for pain.   Eyes Pt has lens implants from cataracts. 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   Pt has been self employed as a Development worker, community for 30 years.   Past Medical History  Diagnosis Date   Cataract    Hernia     umb hernia   Hydrocele, right 10/19/2010   Allergy    Depression      Home Meds: Prior to Admission medications   Medication Sig Start Date End Date Taking? Authorizing Provider  aspirin 81 MG tablet Take 81 mg by mouth daily.      Historical Provider, MD  atorvastatin (LIPITOR) 10 MG tablet Take 1 tablet (10 mg total) by mouth daily. 05/12/13   Orma Flaming, MD  azithromycin (ZITHROMAX Z-PAK) 250 MG tablet Take as directed on pack 08/23/13   John Haber, MD  chlorpheniramine-HYDROcodone [facility]) 10-8 MG/5ML [facility] Take 5 mLs by mouth every 12 (twelve) hours as needed for cough. 08/23/13   John Haber, MD  doxycycline (VIBRA-TABS) 100 MG tablet Take 1 tablet (100 mg total) by mouth 2 (two) times daily. 05/12/13   Orma Flaming, MD  glucose monitoring kit (FREESTYLE) monitoring kit 1 each by Does  not apply route as needed for other. 06/01/13   Eleanore Kurtis Bushman, PA-C  HYDROcodone-acetaminophen (NORCO) 5-325 MG per tablet Take 1 tablet by mouth every 6 (six) hours as needed for pain. 09/26/12   Darlyne Russian, MD  hydroxypropyl methylcellulose (ISOPTO TEARS) 2.5 % ophthalmic solution Place 1 drop into both eyes daily as needed. For dry eyes    Historical Provider, MD  ketoconazole (NIZORAL) 2 % cream Apply topically 2 (two) times daily. APPLY TO ATHLETE'S FOOT BETWEEN TOES. 09/10/12   Wardell Honour, MD  meclizine (ANTIVERT) 25 MG tablet Take 1 tablet (25 mg total) by mouth 4 (four) times daily. 10/31/12   Fransico Meadow, PA-C  meloxicam (MOBIC) 15 MG tablet Take 1 tablet (15 mg total) by mouth daily. 09/26/12   Darlyne Russian, MD  metFORMIN (GLUCOPHAGE) 500 MG tablet Take 1 tablet (500 mg total) by mouth 2 (two) times daily with a meal. 05/01/13   Orma Flaming, MD  mupirocin ointment (BACTROBAN) 2 % Apply 1 application topically 3 (three) times daily. 05/12/13   Orma Flaming, MD    Allergies:  Allergies  Allergen Reactions   Penicillins Rash    History   Social History   Marital Status: Married    Spouse Name: N/A    Number of Children:  N/A   Years of Education: N/A   Occupational History   Not on file.   Social History Main Topics   Smoking status: Never Smoker    Smokeless tobacco: Not on file   Alcohol Use: No   Drug Use: No   Sexual Activity: Not on file   Other Topics Concern   Not on file   Social History Narrative   No narrative on file     Review of Systems: Constitutional: negative for chills, fever, night sweats, weight changes, or fatigue  HEENT: negative for vision changes, hearing loss, congestion, rhinorrhea, ST, epistaxis, or sinus pressure Cardiovascular: negative for chest pain or palpitations Respiratory: negative for hemoptysis, wheezing, shortness of breath, or cough Abdominal: negative for abdominal pain, nausea, vomiting, diarrhea, or  constipation Msk: +neck pain Dermatological: negative for rash Neurologic: negative for headache, dizziness, or syncope Urologic:  Patient having slow urination with hesitancy. All other systems reviewed and are otherwise negative with the exception to those above and in the HPI.   Physical Exam: Blood pressure 122/70, pulse 68, temperature 97.4 F (36.3 C), temperature source Oral, resp. rate 16, height 6' 1.5" (1.867 m), weight 234 lb 6.4 oz (106.323 kg), SpO2 98.00%., Body mass index is 30.5 kg/(m^2). General: Well developed, well nourished, in no acute distress. Head: Normocephalic, atraumatic, eyes without discharge, sclera non-icteric, nares are without discharge. Bilateral auditory canals clear, TM's are without perforation, pearly grey and translucent with reflective cone of light bilaterally. Oral cavity moist, posterior pharynx without exudate, erythema, peritonsillar abscess, or post nasal drip. Upper denture.   Neck: Supple. No thyromegaly. Full ROM. No lymphadenopathy. No bruit. Crepitus in neck with rotation, with slight decrease in rotation each way.  Nontender Lungs: Clear bilaterally to auscultation without wheezes, rales, or rhonchi. Breathing is unlabored. Heart: RRR with S1 S2. No murmurs, rubs, or gallops appreciated. Abdomen: Soft, non-tender, non-distended with normoactive bowel sounds. No hepatomegaly. No rebound/guarding. Patient has a large right hydrocele with possible hernia.  I encouraged him to have Dr. Diona Fanti evaluate this.  He will speak with wife and get back with me Msk:  Strength and tone normal for age.  No muscle wasting Extremities/Skin: Warm and dry. No clubbing or cyanosis. No edema. No rashes or suspicious lesions. Neuro: Alert and oriented X 3. Moves all extremities spontaneously. Gait is normal. CNII-XII grossly in tact.  No loss in touch sensation of hands and arms Psych:  Responds to questions appropriately with a normal affect.   Labs:  Results  for orders placed in visit on 05/12/13  GLUCOSE, POCT (MANUAL RESULT ENTRY)      Result Value Ref Range   POC Glucose 99  70 - 99 mg/dl  [facility] reading (PRIMARY) by  Dr. Joseph Art:  Mild diffuse cervical spurring and lipping with no bony defects or subluxation    ASSESSMENT AND PLAN:  73 y.o. year old male with Type 2 diabetes mellitus without complication - Plan: Lipid panel, COMPLETE METABOLIC PANEL WITH GFR, POCT glycosylated hemoglobin (Hb A1C), POCT urinalysis dipstick  Neck pain - Plan: DG Cervical Spine 2 or 3 views  Hyperlipidemia  BPH (benign prostatic hyperplasia) - Plan: tamsulosin (FLOMAX) 0.4 MG CAPS capsule  Hydrocele, unspecified hydrocele type I have encouraged patient to pursue surgical correction for this translucent right scrotal mass  I personally performed the services described in this documentation, which was scribed in my presence. The recorded information has been reviewed and is accurate.  Signed, John Haber, MD 01/18/2014 8:27 AM

## 2014-01-20 ENCOUNTER — Telehealth: Payer: Self-pay

## 2014-01-20 DIAGNOSIS — M542 Cervicalgia: Secondary | ICD-10-CM

## 2014-01-20 MED ORDER — TRAMADOL HCL 50 MG PO TABS: 50.0000 mg | ORAL_TABLET | Freq: Three times a day (TID) | ORAL | Status: DC | PRN

## 2014-01-20 NOTE — Telephone Encounter (Signed)
Rx has been faxed. Pt notified.

## 2014-01-20 NOTE — Telephone Encounter (Signed)
Bonita QuinLinda states her husband thought for sure Dr.Kurt was going to call him in some pain medicine along with the Flomax he was given Please call [phone]    [facility] [address]

## 2014-01-25 ENCOUNTER — Ambulatory Visit: Payer: BLUE CROSS/BLUE SHIELD | Admitting: *Deleted

## 2014-02-02 ENCOUNTER — Ambulatory Visit (INDEPENDENT_AMBULATORY_CARE_PROVIDER_SITE_OTHER): Payer: Medicare HMO

## 2014-02-02 ENCOUNTER — Ambulatory Visit (INDEPENDENT_AMBULATORY_CARE_PROVIDER_SITE_OTHER): Payer: Medicare HMO | Admitting: Family Medicine

## 2014-02-02 VITALS — BP 150/64 | HR 86 | Temp 98.9°F | Resp 16 | Ht 74.5 in | Wt 214.0 lb

## 2014-02-02 DIAGNOSIS — R059 Cough, unspecified: Secondary | ICD-10-CM

## 2014-02-02 DIAGNOSIS — R05 Cough: Secondary | ICD-10-CM

## 2014-02-02 DIAGNOSIS — R509 Fever, unspecified: Secondary | ICD-10-CM

## 2014-02-02 MED ORDER — CEFTRIAXONE SODIUM 1 G IJ SOLR
1.0000 g | INTRAMUSCULAR | Status: DC
  Administered 2014-02-02: 1 g via INTRAMUSCULAR

## 2014-02-02 MED ORDER — HYDROCOD POLST-CHLORPHEN POLST 10-8 MG/5ML PO LQCR
5.0000 mL | Freq: Two times a day (BID) | ORAL | Status: DC | PRN
Start: 2014-02-02 — End: 2014-09-23

## 2014-02-02 MED ORDER — LEVOFLOXACIN 500 MG PO TABS: 500.0000 mg | ORAL_TABLET | Freq: Every day | ORAL | Status: DC

## 2014-02-02 NOTE — Patient Instructions (Signed)

## 2014-02-02 NOTE — Progress Notes (Signed)
   Subjective:    Patient ID: John [name], male    DOB: [birth year], 73 y.o.   MRN: [id number] This chart was scribed for Elvina SidleKurt Marvetta Vohs, MD by Littie Deedsichard Sun, Medical Scribe. This patient was seen in Room 10 and the patient's care was started at 1:40 PM.   HPI HPI Comments: John [name] is a 73 y.o. male who presents to the [facility] complaining of gradual onset URI symptoms that began 2 days ago. Patient reports having productive cough of green/yellow sputum, sore throat, subjective fever and difficulty sleeping due to cough. He has allergies to penicillins, which give him a rash. He thinks he has had an abx shot before. Patient denies inhaler use.   Review of Systems  Constitutional: Positive for fever.  HENT: Positive for sore throat.   Eyes: Negative for discharge.  Respiratory: Positive for cough.   Cardiovascular: Negative for chest pain.  Gastrointestinal: Negative for abdominal pain.  Genitourinary: Negative for hematuria.  Musculoskeletal: Negative for back pain.  Skin: Negative for rash.  Neurological: Negative for seizures.  Psychiatric/Behavioral: Positive for sleep disturbance.       Objective:   Physical Exam CONSTITUTIONAL: Well developed/well nourished HEAD: Normocephalic/atraumatic EYES: EOM/PERRL ENMT: Mucous membranes moist NECK: supple no meningeal signs SPINE: entire spine nontender CV: S1/S2 noted, no murmurs/rubs/gallops noted LUNGS: Left, lower posterior lung field has rales. ABDOMEN: soft, nontender, no rebound or guarding GU: no cva tenderness NEURO: Pt is awake/alert, moves all extremitiesx4 EXTREMITIES: pulses normal, full ROM SKIN: warm, color normal PSYCH: no abnormalities of mood noted   [facility] preliminary x-ray report read by Dr. 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      Assessment & Plan:    Cough - Plan: DG Chest 2 View, cefTRIAXone (ROCEPHIN) injection 1 g, levofloxacin (LEVAQUIN) 500  MG tablet, chlorpheniramine-HYDROcodone (TUSSIONEX PENNKINETIC ER) 10-8 MG/5ML LQCR  Fever, unspecified fever cause - Plan: DG Chest 2 View, cefTRIAXone (ROCEPHIN) injection 1 g, levofloxacin (LEVAQUIN) 500 MG tablet, chlorpheniramine-HYDROcodone (TUSSIONEX PENNKINETIC ER) 10-8 MG/5ML LQCR  Signed, Elvina SidleKurt Alijah Akram, MD

## 2014-02-11 ENCOUNTER — Ambulatory Visit: Payer: Medicare HMO | Admitting: Family Medicine

## 2014-04-28 ENCOUNTER — Telehealth: Payer: Self-pay

## 2014-04-28 NOTE — Telephone Encounter (Signed)
Spoke with pt's wife, advised his AIC at 5.9 is not considered in the diabetes range. He was at 8.1 in Feb of 2015 but now he is in good range with his A1C.

## 2014-04-28 NOTE — Telephone Encounter (Signed)
[phone]  Bonita QuinLinda, wife would like to know if 5.9 on A1C is considered Diabetic   Linda [phone]- [phone]

## 2014-09-23 ENCOUNTER — Ambulatory Visit (INDEPENDENT_AMBULATORY_CARE_PROVIDER_SITE_OTHER): Payer: PPO | Admitting: Physician Assistant

## 2014-09-23 VITALS — BP 124/64 | HR 75 | Temp 98.3°F | Resp 20 | Ht 74.0 in | Wt 243.0 lb

## 2014-09-23 DIAGNOSIS — H00013 Hordeolum externum right eye, unspecified eyelid: Secondary | ICD-10-CM | POA: Diagnosis not present

## 2014-09-23 NOTE — Progress Notes (Signed)
[facility] [address], [place] [address] [phone]  Date:  09/23/2014   Name:  John [name]   DOB:  1940-04-08   MRN:  [id number]  PCP:  No PCP Per Patient    Chief Complaint: Eye Problem   History of Present Illness:  This is a 74 y.o. male with PMH DM2, HLD who is presenting with a possible stye on his right upper eyelid that started 5 days ago. States he feels it is getting bigger. This morning he noticed a "pimple" forming. He tried popping it with tweezers, using warm compresses, putting visine eyedrops in his eye and applying rubbing alcohol - nothing is helping. His vision is comprised because of the swollen upper eyelid. He denies eyeball pain, itching, fever or chills. He has never had a stye before.  Pt has DM and HLD controlled by diet. He used to be on medication for HLD and DM but no longer needs it. Lipid panel 8 months ago normal off meds. A1C 5.9 off meds.  Review of Systems:  Review of Systems See HPI.  Patient Active Problem List   Diagnosis Date Noted  . Diabetes type 2, controlled 05/12/2013  . Dyslipidemia 05/12/2013  . Umbilical hernia 10/19/2010  . Hydrocele, right 10/19/2010    Prior to Admission medications   Medication Sig Start Date End Date Taking? Authorizing Provider  aspirin 81 MG tablet Take 81 mg by mouth daily.   Yes Historical Provider, MD                                Allergies  Allergen Reactions  . Penicillins Rash    Past Surgical History  Procedure Laterality Date  . Eye surgery    . Hernia repair  12/18/10    umbilical hernia repair with mesh    History  Substance Use Topics  . Smoking status: Never Smoker   . Smokeless tobacco: Never Used  . Alcohol Use: No    Family History  Problem Relation Age of Onset  . Heart disease Mother   . Alcohol abuse Mother   . Alcohol abuse Father     Medication list has been reviewed and updated.  Physical Examination:  Physical Exam   Constitutional: He is oriented to person, place, and time. He appears well-developed and well-nourished. No distress.  HENT:  Head: Normocephalic and atraumatic.  Right Ear: Hearing normal.  Left Ear: Hearing normal.  Nose: Nose normal.  Mouth/Throat: Uvula is midline, oropharynx is clear and moist and mucous membranes are normal.  Eyes: Conjunctivae and EOM are normal. Pupils are equal, round, and reactive to light. Right eye exhibits hordeolum (Swollen and erythematous upper lid. external hordeolum with pustule formation at upper lid margin. swollen upper lid obstructing superior visiual field). Right eye exhibits no discharge. Left eye exhibits no discharge and no hordeolum. No scleral icterus.  Cardiovascular: Normal rate, regular rhythm and normal pulses.   Pulmonary/Chest: Effort normal and breath sounds normal. No respiratory distress.  Musculoskeletal: Normal range of motion.  Lymphadenopathy:       Head (right side): No submental, no submandibular and no tonsillar adenopathy present.       Head (left side): No submental, no submandibular and no tonsillar adenopathy present.    He has no cervical adenopathy.  Neurological: He is alert and oriented to person, place, and time.  Skin: Skin is warm, dry and intact.  No lesion and no rash noted.  Psychiatric: He has a normal mood and affect. His speech is normal and behavior is normal. Thought content normal.   BP 124/64 mmHg  Pulse 75  Temp(Src) 98.3 F (36.8 C) (Oral)  Resp 20  Ht 6\' 2"  (1.88 m)  Wt 243 lb (110.224 kg)  BMI 31.19 kg/m2  SpO2 95%   Visual Acuity Screening   Right eye Left eye Both eyes  Without correction: 20/50 20/30 20/30   With correction:       Assessment and Plan:  1. Hordeolum externum, right Reassured pt that sty should go away with application of warm compresses. Advised to not try to pop it and do not apply any ointments/solutions and if going to apply eye drops should be lubricating eye drops only.  He will return in 1 week if symptoms not resolving or at any time if symptoms worsen. Discussed return precautions.   Roswell MinersNicole V. Dyke BrackettBush, PA-C, MHS [facility]  09/23/2014

## 2014-09-23 NOTE — Patient Instructions (Signed)
Apply warm compresses several times a day. Stop putting visine drops in your eye. You can put lubricating or rewetting drops in your eye but no other ingredients should be in the drops. Do NOT squeeze your eyelid or put any alcohol or ointment on your eyelid. Ibuprofen/tylenol for pain. The sty should drain on its own without problems. Return if you are still having problems in 1 week.  Sty A sty (hordeolum) is an infection of a gland in the eyelid located at the base of the eyelash. A sty may develop a white or yellow head of pus. It can be puffy (swollen). Usually, the sty will burst and pus will come out on its own. They do not leave lumps in the eyelid once they drain. A sty is often confused with another form of cyst of the eyelid called a chalazion. Chalazions occur within the eyelid and not on the edge where the bases of the eyelashes are. They often are red, sore and then form firm lumps in the eyelid. CAUSES   Germs (bacteria).  Lasting (chronic) eyelid inflammation. SYMPTOMS   Tenderness, redness and swelling along the edge of the eyelid at the base of the eyelashes.  Sometimes, there is a white or yellow head of pus. It may or may not drain. DIAGNOSIS  An ophthalmologist will be able to distinguish between a sty and a chalazion and treat the condition appropriately.  TREATMENT   Styes are typically treated with warm packs (compresses) until drainage occurs.  In rare cases, medicines that kill germs (antibiotics) may be prescribed. These antibiotics may be in the form of drops, cream or pills.  If a hard lump has formed, it is generally necessary to do a small incision and remove the hardened contents of the cyst in a minor surgical procedure done in the office.  In suspicious cases, your caregiver may send the contents of the cyst to the lab to be certain that it is not a rare, but dangerous form of cancer of the glands of the eyelid. HOME CARE INSTRUCTIONS   Wash your  hands often and dry them with a clean towel. Avoid touching your eyelid. This may spread the infection to other parts of the eye.  Apply heat to your eyelid for 10 to 20 minutes, several times a day, to ease pain and help to heal it faster.  Do not squeeze the sty. Allow it to drain on its own. Wash your eyelid carefully 3 to 4 times per day to remove any pus. SEEK IMMEDIATE MEDICAL CARE IF:   Your eye becomes painful or puffy (swollen).  Your vision changes.  Your sty does not drain by itself within 3 days.  Your sty comes back within a short period of time, even with treatment.  You have redness (inflammation) around the eye.  You have a fever. Document Released: 12/20/2004 Document Revised: 06/04/2011 Document Reviewed: 06/26/2013 [facility] Patient Information 2015 [employer] [place] [place]. This information is not intended to replace advice given to you by your health care provider. Make sure you discuss any questions you have with your health care provider.

## 2015-05-19 ENCOUNTER — Telehealth: Payer: Self-pay

## 2015-05-19 NOTE — Telephone Encounter (Signed)
Pt is needing a refill on test strips

## 2015-05-20 MED ORDER — GLUCOSE BLOOD VI STRP: ORAL_STRIP | Status: DC

## 2015-05-20 NOTE — Telephone Encounter (Signed)
Sent in RF and notified pt of need for f/up on Vm.

## 2015-06-02 ENCOUNTER — Telehealth: Payer: Self-pay

## 2015-06-02 NOTE — Telephone Encounter (Signed)
Pt is needing a refill on his test trips   Best number [phone]

## 2015-06-03 MED ORDER — GLUCOSE BLOOD VI STRP: ORAL_STRIP | Status: DC

## 2015-06-03 NOTE — Telephone Encounter (Signed)
Spoke with wife, she requests a refill and states he will come in to see Dr. Milus GlazierLauenstein one day next week in the walk in. Can we refill?

## 2015-07-27 ENCOUNTER — Ambulatory Visit (INDEPENDENT_AMBULATORY_CARE_PROVIDER_SITE_OTHER): Payer: PPO | Admitting: Family Medicine

## 2015-07-27 VITALS — BP 130/80 | HR 78 | Temp 98.7°F | Resp 18 | Ht 74.0 in | Wt 235.0 lb

## 2015-07-27 DIAGNOSIS — J209 Acute bronchitis, unspecified: Secondary | ICD-10-CM

## 2015-07-27 MED ORDER — HYDROCODONE-HOMATROPINE 5-1.5 MG/5ML PO SYRP: 5.0000 mL | ORAL_SOLUTION | Freq: Three times a day (TID) | ORAL | Status: DC | PRN

## 2015-07-27 MED ORDER — LEVOFLOXACIN 500 MG PO TABS: 500.0000 mg | ORAL_TABLET | Freq: Every day | ORAL | Status: DC

## 2015-07-27 MED ORDER — METHYLPREDNISOLONE ACETATE 80 MG/ML IJ SUSP
80.0000 mg | Freq: Once | INTRAMUSCULAR | Status: AC
  Administered 2015-07-27: 80 mg via INTRAMUSCULAR

## 2015-07-27 NOTE — Patient Instructions (Addendum)

## 2015-07-27 NOTE — Progress Notes (Signed)
Patient ID: John [name] MRN: [id number], DOB: 1940-11-16, 75 y.o. Date of Encounter: 07/27/2015, 7:03 PM  Primary Physician: No PCP Per Patient  Chief Complaint:  Chief Complaint  Patient presents with  . Cough    poss pneumonia    HPI: 75 y.o. year old male presents with a 6 day history of nasal congestion, post nasal drip, sore throat, and cough. Mild sinus pressure. Afebrile. No chills. Nasal congestion thick and green/yellow. Cough is productive of green/yellow sputum and not associated with time of day. Ears feel full, leading to sensation of muffled hearing. Has tried OTC cold preps without success. No GI complaints.   No sick contacts, recent antibiotics, or recent travels.   No leg trauma, sedentary periods, h/o cancer, or tobacco use.  Patient still working as a Nutritional therapistplumber  Past Medical History  Diagnosis Date  . Cataract   . Hernia     umb hernia  . Hydrocele, right 10/19/2010  . Allergy   . Depression      Home Meds: Prior to Admission medications   Medication Sig Start Date End Date Taking? Authorizing Provider  aspirin 81 MG tablet Take 81 mg by mouth daily.   Yes Historical Provider, MD  glucose blood test strip Test blood sugar daily. Dx: E11.9 06/03/15  Yes Elvina SidleKurt Montgomery Favor, MD  HYDROcodone-homatropine [facility](HYCODAN) 5-1.5 MG/5ML syrup Take 5 mLs by mouth every 8 (eight) hours as needed for cough. 07/27/15   Elvina SidleKurt Rawan Riendeau, MD  levofloxacin (LEVAQUIN) 500 MG tablet Take 1 tablet (500 mg total) by mouth daily. 07/27/15   Elvina SidleKurt Amorie Rentz, MD    Allergies:  Allergies  Allergen Reactions  . Penicillins Rash    Social History   Social History  . Marital Status: Married    Spouse Name: N/A  . Number of Children: N/A  . Years of Education: N/A   Occupational History  . Not on file.   Social History Main Topics  . Smoking status: Never Smoker   . Smokeless tobacco: Never Used  . Alcohol Use: No  . Drug Use: No  . Sexual Activity: Not on file   Other  Topics Concern  . Not on file   Social History Narrative     Review of Systems: Constitutional: negative for chills, fever, night sweats or weight changes Cardiovascular: negative for chest pain or palpitations Respiratory: negative for hemoptysis, wheezing, or shortness of breath Abdominal: negative for abdominal pain, nausea, vomiting or diarrhea Dermatological: negative for rash Neurologic: negative for headache   Physical Exam: Blood pressure 130/80, pulse 78, temperature 98.7 F (37.1 C), temperature source Oral, resp. rate 18, height 6\' 2"  (1.88 m), weight 235 lb (106.595 kg), SpO2 97 %., Body mass index is 30.16 kg/(m^2). General: Well developed, well nourished, in no acute distress. Head: Normocephalic, atraumatic, eyes without discharge, sclera non-icteric, nares are congested. Bilateral auditory canals clear, TM's are without perforation, pearly grey with reflective cone of light bilaterally. No sinus TTP. Oral cavity moist, dentition normal. Posterior pharynx with post nasal drip and mild erythema. No peritonsillar abscess or tonsillar exudate. Neck: Supple. No thyromegaly. Full ROM. No lymphadenopathy. Lungs: Coarse breath sounds bilaterally without wheezes, rales, or rhonchi. Breathing is unlabored.  Heart: RRR with S1 S2. No murmurs, rubs, or gallops appreciated. Msk:  Strength and tone normal for age. Extremities: No clubbing or cyanosis. No edema. Neuro: Alert and oriented X 3. Moves all extremities spontaneously. CNII-XII grossly in tact. Psych:  Responds to questions appropriately with a normal  affect.     ASSESSMENT AND PLAN:  75 y.o. year old male with bronchitis. -   ICD-9-CM ICD-10-CM   1. Acute bronchitis, unspecified organism 466.0 J20.9 levofloxacin (LEVAQUIN) 500 MG tablet     HYDROcodone-homatropine (HYCODAN) 5-1.5 MG/5ML syrup     methylPREDNISolone acetate (DEPO-MEDROL) injection 80 mg   -Tylenol/Motrin prn -Rest/fluids -RTC precautions -RTC 3-5  days if no improvement  Signed, Elvina Sidle, MD 07/27/2015 7:03 PM

## 2015-08-06 ENCOUNTER — Other Ambulatory Visit: Payer: Self-pay | Admitting: Family Medicine

## 2015-08-14 ENCOUNTER — Observation Stay (HOSPITAL_COMMUNITY)
Admission: EM | Admit: 2015-08-14 | Discharge: 2015-08-16 | Disposition: A | Discharge status: Home / Self Care | Payer: PPO | Attending: Internal Medicine | Admitting: Internal Medicine

## 2015-08-14 ENCOUNTER — Encounter (HOSPITAL_COMMUNITY): Payer: Self-pay | Admitting: Emergency Medicine

## 2015-08-14 DIAGNOSIS — E119 Type 2 diabetes mellitus without complications: Secondary | ICD-10-CM

## 2015-08-14 DIAGNOSIS — R079 Chest pain, unspecified: Secondary | ICD-10-CM | POA: Diagnosis not present

## 2015-08-14 DIAGNOSIS — R0989 Other specified symptoms and signs involving the circulatory and respiratory systems: Secondary | ICD-10-CM | POA: Diagnosis not present

## 2015-08-14 DIAGNOSIS — Z880 Allergy status to penicillin: Secondary | ICD-10-CM | POA: Diagnosis not present

## 2015-08-14 DIAGNOSIS — I10 Essential (primary) hypertension: Secondary | ICD-10-CM | POA: Diagnosis not present

## 2015-08-14 DIAGNOSIS — R61 Generalized hyperhidrosis: Secondary | ICD-10-CM | POA: Diagnosis not present

## 2015-08-14 DIAGNOSIS — Z7982 Long term (current) use of aspirin: Secondary | ICD-10-CM | POA: Diagnosis not present

## 2015-08-14 DIAGNOSIS — Z8249 Family history of ischemic heart disease and other diseases of the circulatory system: Secondary | ICD-10-CM | POA: Insufficient documentation

## 2015-08-14 DIAGNOSIS — E785 Hyperlipidemia, unspecified: Secondary | ICD-10-CM | POA: Insufficient documentation

## 2015-08-14 DIAGNOSIS — R05 Cough: Secondary | ICD-10-CM | POA: Diagnosis present

## 2015-08-14 DIAGNOSIS — I25110 Atherosclerotic heart disease of native coronary artery with unstable angina pectoris: Principal | ICD-10-CM | POA: Insufficient documentation

## 2015-08-14 DIAGNOSIS — R059 Cough, unspecified: Secondary | ICD-10-CM | POA: Diagnosis present

## 2015-08-14 DIAGNOSIS — M79602 Pain in left arm: Secondary | ICD-10-CM

## 2015-08-14 HISTORY — DX: Other seasonal allergic rhinitis: J30.2

## 2015-08-14 HISTORY — DX: Prediabetes: R73.03

## 2015-08-14 HISTORY — DX: Umbilical hernia without obstruction or gangrene: K42.9

## 2015-08-14 LAB — COMPREHENSIVE METABOLIC PANEL
ALK PHOS: 55 U/L (ref 38–126)
ALT: 27 U/L (ref 17–63)
AST: 24 U/L (ref 15–41)
Albumin: 4.1 g/dL (ref 3.5–5.0)
Anion gap: 10 (ref 5–15)
BUN: 15 mg/dL (ref 6–20)
CALCIUM: 9.4 mg/dL (ref 8.9–10.3)
CO2: 24 mmol/L (ref 22–32)
CREATININE: 0.78 mg/dL (ref 0.61–1.24)
Chloride: 103 mmol/L (ref 101–111)
Glucose, Bld: 155 mg/dL — ABNORMAL HIGH (ref 65–99)
Potassium: 4.2 mmol/L (ref 3.5–5.1)
Sodium: 137 mmol/L (ref 135–145)
Total Bilirubin: 0.7 mg/dL (ref 0.3–1.2)
Total Protein: 7.1 g/dL (ref 6.5–8.1)

## 2015-08-14 LAB — CBC WITH DIFFERENTIAL/PLATELET
Basophils Absolute: 0 10*3/uL (ref 0.0–0.1)
Basophils Relative: 0 %
EOS PCT: 4 %
Eosinophils Absolute: 0.3 10*3/uL (ref 0.0–0.7)
HCT: 43.7 % (ref 39.0–52.0)
HEMOGLOBIN: 14.5 g/dL (ref 13.0–17.0)
LYMPHS ABS: 2.2 10*3/uL (ref 0.7–4.0)
LYMPHS PCT: 34 %
MCH: 29.9 pg (ref 26.0–34.0)
MCHC: 33.2 g/dL (ref 30.0–36.0)
MCV: 90.1 fL (ref 78.0–100.0)
Monocytes Absolute: 0.6 10*3/uL (ref 0.1–1.0)
Monocytes Relative: 9 %
Neutro Abs: 3.5 10*3/uL (ref 1.7–7.7)
Neutrophils Relative %: 53 %
PLATELETS: 182 10*3/uL (ref 150–400)
RBC: 4.85 MIL/uL (ref 4.22–5.81)
RDW: 13.1 % (ref 11.5–15.5)
WBC: 6.6 10*3/uL (ref 4.0–10.5)

## 2015-08-14 NOTE — ED Notes (Signed)
Pt. reports intermittent left upper arm pain onset last month and again this evening , denies injury . No chest pain or SOB .

## 2015-08-15 ENCOUNTER — Encounter (HOSPITAL_COMMUNITY): Payer: Self-pay | Admitting: Cardiology

## 2015-08-15 ENCOUNTER — Emergency Department (HOSPITAL_COMMUNITY): Payer: PPO

## 2015-08-15 ENCOUNTER — Observation Stay (HOSPITAL_COMMUNITY): Payer: PPO

## 2015-08-15 ENCOUNTER — Observation Stay (HOSPITAL_BASED_OUTPATIENT_CLINIC_OR_DEPARTMENT_OTHER): Payer: PPO

## 2015-08-15 DIAGNOSIS — R7303 Prediabetes: Secondary | ICD-10-CM

## 2015-08-15 DIAGNOSIS — R079 Chest pain, unspecified: Secondary | ICD-10-CM | POA: Diagnosis present

## 2015-08-15 DIAGNOSIS — M79602 Pain in left arm: Secondary | ICD-10-CM | POA: Diagnosis present

## 2015-08-15 DIAGNOSIS — R059 Cough, unspecified: Secondary | ICD-10-CM | POA: Diagnosis present

## 2015-08-15 DIAGNOSIS — R9439 Abnormal result of other cardiovascular function study: Secondary | ICD-10-CM | POA: Diagnosis not present

## 2015-08-15 DIAGNOSIS — I200 Unstable angina: Secondary | ICD-10-CM

## 2015-08-15 DIAGNOSIS — Z8249 Family history of ischemic heart disease and other diseases of the circulatory system: Secondary | ICD-10-CM

## 2015-08-15 DIAGNOSIS — R05 Cough: Secondary | ICD-10-CM | POA: Diagnosis present

## 2015-08-15 DIAGNOSIS — R0989 Other specified symptoms and signs involving the circulatory and respiratory systems: Secondary | ICD-10-CM | POA: Diagnosis not present

## 2015-08-15 LAB — NM MYOCAR MULTI W/SPECT W/WALL MOTION / EF
CHL CUP MPHR: 146 {beats}/min
CHL CUP NUCLEAR SRS: 3
CHL CUP NUCLEAR SSS: 4
CHL CUP RESTING HR STRESS: 60 {beats}/min
CHL CUP STRESS STAGE 1 DBP: 76 mmHg
CHL CUP STRESS STAGE 2 GRADE: 0 %
CHL CUP STRESS STAGE 2 HR: 76 {beats}/min
CHL CUP STRESS STAGE 4 GRADE: 12 %
CHL CUP STRESS STAGE 4 HR: 136 {beats}/min
CHL CUP STRESS STAGE 5 DBP: 134 mmHg
CHL CUP STRESS STAGE 5 GRADE: 0 %
CHL CUP STRESS STAGE 5 SPEED: 0 mph
CHL CUP STRESS STAGE 6 DBP: 81 mmHg
CHL CUP STRESS STAGE 6 GRADE: 0 %
CHL CUP STRESS STAGE 6 HR: 78 {beats}/min
CHL CUP STRESS STAGE 6 SBP: 176 mmHg
CSEPPHR: 136 {beats}/min
Estimated workload: 5.5 METS
Exercise duration (min): 3 min
Exercise duration (sec): 49 s
LV dias vol: 135 mL (ref 62–150)
LV sys vol: 68 mL
Peak BP: 213 mmHg
Percent HR: 96 %
Percent of predicted max HR: 93 %
RATE: 0.34
RPE: 17
SDS: 1
Stage 1 Grade: 0 %
Stage 1 HR: 76 {beats}/min
Stage 1 SBP: 149 mmHg
Stage 1 Speed: 0 mph
Stage 2 Speed: 0 mph
Stage 3 Grade: 10 %
Stage 3 HR: 134 {beats}/min
Stage 3 Speed: 1.7 mph
Stage 4 DBP: 104 mmHg
Stage 4 SBP: 213 mmHg
Stage 4 Speed: 2.5 mph
Stage 5 HR: 121 {beats}/min
Stage 5 SBP: 225 mmHg
Stage 6 Speed: 0 mph
TID: 0.95

## 2015-08-15 LAB — TROPONIN I: Troponin I: <0.03 ng/mL (ref <0.031)

## 2015-08-15 MED ORDER — GUAIFENESIN-DM 100-10 MG/5ML PO SYRP: 5.0000 mL | ORAL_SOLUTION | ORAL | Status: AC | PRN

## 2015-08-15 MED ORDER — METOPROLOL SUCCINATE ER 25 MG PO TB24
25.0000 mg | ORAL_TABLET | Freq: Every day | ORAL | Status: DC
  Administered 2015-08-15: 25 mg via ORAL
  Filled 2015-08-15: qty 1

## 2015-08-15 MED ORDER — ASPIRIN 81 MG PO CHEW
81.0000 mg | CHEWABLE_TABLET | ORAL | Status: AC
  Administered 2015-08-16: 81 mg via ORAL
  Filled 2015-08-15: qty 1

## 2015-08-15 MED ORDER — TECHNETIUM TC 99M TETROFOSMIN IV KIT
10.0000 | PACK | Freq: Once | INTRAVENOUS | Status: AC | PRN
  Administered 2015-08-15: 10 via INTRAVENOUS

## 2015-08-15 MED ORDER — GUAIFENESIN ER 600 MG PO TB12: 600.0000 mg | ORAL_TABLET | Freq: Two times a day (BID) | ORAL | Status: AC

## 2015-08-15 MED ORDER — GUAIFENESIN-DM 100-10 MG/5ML PO SYRP
5.0000 mL | ORAL_SOLUTION | ORAL | Status: DC | PRN
  Administered 2015-08-15 – 2015-08-16 (×2): 5 mL via ORAL
  Filled 2015-08-15 (×2): qty 5

## 2015-08-15 MED ORDER — LOSARTAN POTASSIUM 25 MG PO TABS
25.0000 mg | ORAL_TABLET | Freq: Every day | ORAL | Status: DC
  Administered 2015-08-15: 25 mg via ORAL
  Filled 2015-08-15: qty 1

## 2015-08-15 MED ORDER — ACETAMINOPHEN 325 MG PO TABS: 650.0000 mg | ORAL_TABLET | ORAL | Status: DC | PRN

## 2015-08-15 MED ORDER — ASPIRIN 81 MG PO CHEW: 81.0000 mg | CHEWABLE_TABLET | Freq: Every day | ORAL | Status: DC

## 2015-08-15 MED ORDER — TECHNETIUM TC 99M TETROFOSMIN IV KIT
30.0000 | PACK | Freq: Once | INTRAVENOUS | Status: AC | PRN
  Administered 2015-08-15: 30 via INTRAVENOUS

## 2015-08-15 MED ORDER — BENZONATATE 100 MG PO CAPS
200.0000 mg | ORAL_CAPSULE | Freq: Three times a day (TID) | ORAL | Status: DC | PRN
  Administered 2015-08-15 (×2): 200 mg via ORAL
  Filled 2015-08-15 (×2): qty 2

## 2015-08-15 MED ORDER — ENOXAPARIN SODIUM 40 MG/0.4ML ~~LOC~~ SOLN
40.0000 mg | SUBCUTANEOUS | Status: DC
  Filled 2015-08-15: qty 0.4

## 2015-08-15 MED ORDER — SODIUM CHLORIDE 0.9 % IV SOLN: 250.0000 mL | INTRAVENOUS | Status: DC | PRN

## 2015-08-15 MED ORDER — ONDANSETRON HCL 4 MG/2ML IJ SOLN: 4.0000 mg | Freq: Four times a day (QID) | INTRAMUSCULAR | Status: DC | PRN

## 2015-08-15 MED ORDER — ASPIRIN 81 MG PO CHEW
324.0000 mg | CHEWABLE_TABLET | Freq: Once | ORAL | Status: AC
  Administered 2015-08-15: 324 mg via ORAL
  Filled 2015-08-15: qty 4

## 2015-08-15 NOTE — H&P (Addendum)
History and Physical    John BrightWayne R Davie WUJ:[id number] DOB: March 22, 1941 DOA: 08/14/2015   PCP: No PCP Per Patient Chief Complaint:  Chief Complaint  Patient presents with  . Arm Pain    HPI: John [name] is a 75 y.o. male with medical history significant of HLD, fhx CAD, borderline DM.  Patient presents tot he ED with c/o moderate, intermittent left upper arm pain.  Symptoms onset last evening at 7pm with associated diaphoresis.  He has had persistent cough for the past week.  Pain is sharp and tight and definitely worse with coughing he says.  Pain unaffected by movement of the arm.  No chest pain specifically he reports.  ED Course: Trop negative, EKG unremarkable.  EDP is worried about ACS.  Review of Systems: As per HPI otherwise 10 point review of systems negative.    Past Medical History  Diagnosis Date  . Cataract   . Hernia     umb hernia  . Hydrocele, right 10/19/2010  . Allergy   . Depression     Past Surgical History  Procedure Laterality Date  . Eye surgery    . Hernia repair  12/18/10    umbilical hernia repair with mesh     reports that he has never smoked. He has never used smokeless tobacco. He reports that he does not drink alcohol or use illicit drugs.  Allergies  Allergen Reactions  . Penicillins Rash    Family History  Problem Relation Age of Onset  . Heart disease Mother   . Alcohol abuse Mother   . Alcohol abuse Father      Prior to Admission medications   Medication Sig Start Date End Date Taking? Authorizing Provider  aspirin 81 MG tablet Take 81 mg by mouth daily.   Yes Historical Provider, MD  FREESTYLE LITE test strip TEST BLOOD SUGAR DAILY PT NEEDS OFFICE VISIT 08/08/15   Elvina SidleKurt Lauenstein, MD  HYDROcodone-homatropine [facility](HYCODAN) 5-1.5 MG/5ML syrup Take 5 mLs by mouth every 8 (eight) hours as needed for cough. Patient not taking: Reported on 08/15/2015 07/27/15   Elvina SidleKurt Lauenstein, MD  levofloxacin (LEVAQUIN) 500 MG tablet Take 1 tablet (500 mg  total) by mouth daily. Patient not taking: Reported on 08/15/2015 07/27/15   Elvina SidleKurt Lauenstein, MD    Physical Exam: Filed Vitals:   08/15/15 0030 08/15/15 0100 08/15/15 0150 08/15/15 0200  BP: 116/66 129/80 122/78 124/74  Pulse: 63 56 63 58  Temp:      TempSrc:      Resp:   18   SpO2: 92% 96% 99% 98%      Constitutional: NAD, calm, comfortable Eyes: PERRL, lids and conjunctivae normal ENMT: Mucous membranes are moist. Posterior pharynx clear of any exudate or lesions.Normal dentition.  Neck: normal, supple, no masses, no thyromegaly Respiratory: clear to auscultation bilaterally, no wheezing, no crackles. Normal respiratory effort. No accessory muscle use.  Cardiovascular: Regular rate and rhythm, no murmurs / rubs / gallops. No extremity edema. 2+ pedal pulses. No carotid bruits.  Abdomen: no tenderness, no masses palpated. No hepatosplenomegaly. Bowel sounds positive.  Musculoskeletal: no clubbing / cyanosis. No joint deformity upper and lower extremities. Good ROM, no contractures. Normal muscle tone.  Skin: no rashes, lesions, ulcers. No induration Neurologic: CN 2-12 grossly intact. Sensation intact, DTR normal. Strength 5/5 in all 4.  Psychiatric: Normal judgment and insight. Alert and oriented x 3. Normal mood.    Labs on Admission: I have personally reviewed following labs and imaging studies  CBC:  Recent Labs Lab 08/14/15 2106  WBC 6.6  NEUTROABS 3.5  HGB 14.5  HCT 43.7  MCV 90.1  PLT 182   Basic Metabolic Panel:  Recent Labs Lab 08/14/15 2106  NA 137  K 4.2  CL 103  CO2 24  GLUCOSE 155*  BUN 15  CREATININE 0.78  CALCIUM 9.4   GFR: CrCl cannot be calculated (Unknown ideal weight.). Liver Function Tests:  Recent Labs Lab 08/14/15 2106  AST 24  ALT 27  ALKPHOS 55  BILITOT 0.7  PROT 7.1  ALBUMIN 4.1   No results for input(s): LIPASE, AMYLASE in the last 168 hours. No results for input(s): AMMONIA in the last 168 hours. Coagulation  Profile: No results for input(s): INR, PROTIME in the last 168 hours. Cardiac Enzymes:  Recent Labs Lab 08/15/15 0053  TROPONINI <0.03   BNP (last 3 results) No results for input(s): PROBNP in the last 8760 hours. HbA1C: No results for input(s): HGBA1C in the last 72 hours. CBG: No results for input(s): GLUCAP in the last 168 hours. Lipid Profile: No results for input(s): CHOL, HDL, LDLCALC, TRIG, CHOLHDL, LDLDIRECT in the last 72 hours. Thyroid Function Tests: No results for input(s): TSH, T4TOTAL, FREET4, T3FREE, THYROIDAB in the last 72 hours. Anemia Panel: No results for input(s): VITAMINB12, FOLATE, FERRITIN, TIBC, IRON, RETICCTPCT in the last 72 hours. Urine analysis:    Component Value Date/Time   COLORURINE AMBER BIOCHEMICALS MAY BE AFFECTED BY COLOR* 02/20/2009 0021   APPEARANCEUR CLOUDY* 02/20/2009 0021   LABSPEC 1.038* 02/20/2009 0021   PHURINE 5.0 02/20/2009 0021   GLUCOSEU 250* 02/20/2009 0021   HGBUR NEGATIVE 02/20/2009 0021   BILIRUBINUR neg 01/18/2014 0902   BILIRUBINUR SMALL* 02/20/2009 0021   KETONESUR NEGATIVE 02/20/2009 0021   PROTEINUR neg 01/18/2014 0902   PROTEINUR NEGATIVE 02/20/2009 0021   UROBILINOGEN 0.2 01/18/2014 0902   UROBILINOGEN 0.2 02/20/2009 0021   NITRITE neg 01/18/2014 0902   NITRITE NEGATIVE 02/20/2009 0021   LEUKOCYTESUR Negative 01/18/2014 0902   Sepsis Labs: (procalcitonin:4,lacticidven:4) )No results found for this or any previous visit (from the past 240 hour(s)).   Radiological Exams on Admission: Dg Chest 2 View  08/15/2015  CLINICAL DATA:  Left arm tightness. EXAM: CHEST  2 VIEW COMPARISON:  02/02/2014 FINDINGS: The cardiomediastinal contours are normal. Minimal subsegmental atelectasis or scarring at the left lung base. No pulmonary edema. No consolidation, pleural effusion, or pneumothorax. No acute osseous abnormalities are seen. IMPRESSION: No acute pulmonary process. Electronically Signed   By: Rubye Oaks M.D.   On: 08/15/2015 02:44    EKG: Independently reviewed.  Assessment/Plan Active Problems:   Chest pain, moderate coronary artery risk   Left arm pain   Chest pain moderate risk - HEART score of 4  Chest pain obs pathway  NPO  Stress test ordered  Serial trops  Patient specifically requests treadmill stress test.  Left arm pain and cough - suppressing cough with tessalon, no evidence of PNA on CXR.   DVT prophylaxis: Lovenox Code Status: Full Family Communication: Wife at bedside Consults called: None  Admission status: Admit to obs   GARDNER, Heywood Iles DO [facility] Pager [phone] from 7PM-7AM  If 7AM-7PM, please contact the day physician for the patient [email or web address] Password [email or web address]  08/15/2015, 4:29 AM

## 2015-08-15 NOTE — Consult Note (Signed)
CARDIOLOGY CONSULT NOTE   Patient ID: John [name] MRN: [id number], DOB/AGE: 1940-09-17   Admit date: 08/14/2015 Date of Consult: 08/15/2015   Primary Physician: Elvina Sidle, MD Primary Cardiologist: Dr. Charlton Haws (2010)  Pt. 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  Problem List  Past Medical History  Diagnosis Date  . Cataract   . Umbilical hernia   . Hydrocele, right 10/19/2010  . Seasonal allergies   . Depression   . Borderline diabetes mellitus     Past Surgical History  Procedure Laterality Date  . Eye surgery    . Umbilical hernia repair  12/18/10    Mesh     Allergies  Allergies  Allergen Reactions  . 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 He says his maternal grandmother had CAD later in life. He reports exertional dyspnea symptoms. According to his wife, he has at least 2 or 3 episodes of left arm pain in the past few weeks. His arm pain tended to occur at rest but he has also had some with exertion. He dug a ditch last Thursday, reportedly without any discomfort. He was at church on Sunday 08/14/2015 when he is started having left arm pain again, and it lasted several minutes before resolving. He was also diaphoretic. He says there are several nurses and doctors who go to church with him recommended him to seek medical attention. He was admitted to hospitalist service overnight, serial troponin was negative. EKG showed no significant ST-T wave changes.   He underwent Myoview on 08/15/2015 which came back abnormal with EF 45-54%, 1 mm upsloping ST depression in V5 and V6 that resolved less than 1 minute into recovery, he had hypertensive response to exercise, medium sized moderate intensity  partially fixed defect in mid and basal inferoseptal and apical septal wall which could represent variation in diaphragmatic attenuation but cannot rule out small area of ischemia, small in size, mild fixed defect in the basal and mid inferior wall that is partially reversible. Cardiology formally consulted for abnormal nuclear study.  Inpatient Medications  . [START ON 08/16/2015] aspirin  81 mg Oral Daily  . enoxaparin (LOVENOX) injection  40 mg Subcutaneous Q24H  . losartan  25 mg Oral Daily  . metoprolol succinate  25 mg Oral Daily    Family History Family History  Problem Relation Age of Onset  . Heart disease Mother   . Alcohol abuse Mother   . Alcohol abuse Father      Social History Social History   Social History  . Marital Status: Married    Spouse Name: N/A  . Number of Children: N/A  . Years of Education: N/A   Occupational History  . Not on file.   Social History Main Topics  . Smoking status: Never Smoker   . Smokeless tobacco: Never Used  . Alcohol Use: No  . Drug Use: No  . Sexual Activity: Not on file   Other Topics Concern  . Not on file   Social History Narrative     Review of Systems  General:  No chills, fever, night sweats or weight changes.  Cardiovascular:  No chest pain, dyspnea on exertion, edema, orthopnea, palpitations, paroxysmal nocturnal dyspnea. +left arm pain Dermatological: No rash, lesions/masses  Respiratory: No cough, dyspnea Urologic: No hematuria, dysuria Abdominal:   No nausea, vomiting, diarrhea, bright red blood per rectum, melena, or hematemesis Neurologic:  No visual changes, wkns, changes in mental status. All other systems reviewed and are otherwise negative except as noted above.  Physical Exam  Blood pressure 121/68, pulse 63, temperature 97.6 F (36.4 C), temperature source Oral, resp. rate 18, height 6\' 2"  (1.88 m), weight 229 lb 0.9 oz (103.9 kg), SpO2 97 %.  General: Pleasant, NAD Psych: Normal  affect. Neuro: Alert and oriented X 3. Moves all extremities spontaneously. HEENT: Normal  Neck: Supple without bruits or JVD. Lungs:  Resp regular and unlabored, CTA. Heart: RRR no s3, s4, or murmurs. Abdomen: Soft, non-tender, non-distended, BS + x 4.  Extremities: No clubbing, cyanosis or edema. DP/PT/Radials 2+ and equal bilaterally.  Labs   Recent Labs  08/15/15 0053 08/15/15 0626 08/15/15 1059 08/15/15 1336  TROPONINI <0.03 <0.03 <0.03 <0.03   Lab Results  Component Value Date   WBC 6.6 08/14/2015   HGB 14.5 08/14/2015   HCT 43.7 08/14/2015   MCV 90.1 08/14/2015   PLT 182 08/14/2015     Recent Labs Lab 08/14/15 2106  NA 137  K 4.2  CL 103  CO2 24  BUN 15  CREATININE 0.78  CALCIUM 9.4  PROT 7.1  BILITOT 0.7  ALKPHOS 55  ALT 27  AST 24  GLUCOSE 155*   Lab Results  Component Value Date   CHOL 152 01/18/2014   HDL 36* 01/18/2014   LDLCALC 90 01/18/2014   TRIG 131 01/18/2014   No results found for: DDIMER  Radiology/Studies  Dg Chest 2 View  08/15/2015  CLINICAL DATA:  Left arm tightness. EXAM: CHEST  2 VIEW COMPARISON:  02/02/2014 FINDINGS: The cardiomediastinal contours are normal. Minimal subsegmental atelectasis or scarring at the left lung base. No pulmonary edema. No consolidation, pleural effusion, or pneumothorax. No acute osseous abnormalities are seen. IMPRESSION: No acute pulmonary process. Electronically Signed   By: Rubye Oaks M.D.   On: 08/15/2015 02:44   Nm Myocar Multi W/spect W/wall Motion / Ef  08/15/2015   Blood pressure demonstrated a hypertensive response to exercise.  No T wave inversion was noted during stress.  ST segment depression was noted during stress in the V5 and V6 leads, and returning to baseline after less than 1 minute of recovery.  There is a medium in size, moderate in intensity, partially fixed defect in the mid and basal inferoseptal and apical septal walls. This could represent variations in diaphragmatic  attenuation but cannot rule out a small area of ischemia.  There is a small in size, mild, fixed defect in the basal and mid inferior wall that is partially reversible. This could represent variations in diaphragmatic attenuation but cannot rule out a small area of ischemia.  This is an intermediate risk study.  The left ventricular ejection fraction is mildly decreased (45-54%).  Nuclear stress EF: 49%.    ECG  NSR without significant ST-T wave changes  ASSESSMENT AND PLAN  1. L arm pain and DOE with abnormal myoview  - symptoms have both typical and atypical features, somewhat atypical, Cardiac risk factors include CAD in her maternal grandmother's side and borderline diabetes mellitus.  - Myoview abnormal, will discuss further workup with MD  2. Prediabetes: per IM  Signed, Azalee Course, PA-C 08/15/2015, 7:19 PM   Attending note:  Patient seen and examined as on call physician this evening. Reviewed records and discussed the  case with Mr. John LeftMeng PA-C. 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 He has had at least 3 episodes of left arm discomfort in the last few weeks, most recently at rest, including a prolonged episode that occurred at church and was associated with diaphoresis. He has a family history of CAD on his maternal grandmother's side, also personal history of borderline diabetes mellitus. He was admitted to the hospital yesterday for observation, has ruled out for myocardial infarction with negative troponin I levels, ECG overall nonspecific. Wife states that he had 2 episodes of recurrent left arm discomfort late yesterday when he was awaiting admission. He underwent an exercise Myoview today as arranged by the internal medicine service. Study is reported above, overall intermediate risk with potential  inferior and small region of apical anteroseptal ischemia, LVEF 49%, abnormal ECG with hypertensive response.  On examination this evening he appears comfortable without active left arm discomfort. Blood pressure 121/68, heart rate in the 60s. Lungs clear without labored breathing, cardiac exam reveals RRR without gallop, no peripheral edema. Lab work shows creatinine 0.7, hemoglobin 14.5, platelets 182. Chest x-ray reports no acute process. Chart indicates prior followup with Dr. Eden EmmsNishan several years ago and a reported normal cardiac catheterization around 2000.  Patient presents with dyspnea on exertion and intermittent left arm discomfort with atypical features. No evidence of ACS by cardiac enzymes, however screening exercise Myoview study was intermediate risk abnormal. I have discussed this with the patient, his wife, and grandson present. Recommendation is a diagnostic cardiac catheterization to clarify the situation further and help guide treatment options. After discussing the potential risks and anticipated benefits, he is in agreement to proceed, and this is being scheduled for tomorrow.  John [name], M.D., F.A.C.C.

## 2015-08-15 NOTE — Progress Notes (Signed)
PROGRESS NOTE                                                                                                                                                                                                             Patient Demographics:    John [name], is a 75 y.o. male, DOB - Oct 08, 1940, ZOX:[id number]  Admit date - 08/14/2015   Admitting Physician Hillary Bow, DO  Outpatient Primary MD for the patient is Elvina Sidle, MD  LOS -   Outpatient Specialists: none  Chief Complaint  Patient presents with  . Arm Pain       Brief Narrative   75 year old male with history of CAD, borderline diabetes mellitus and hyperlipidemia presented with intermittent left upper arm pain associated with diaphoresis 88 he was also having nonproductive cough for the past 2 weeks (treated with Levaquin for acute bronchitis 2 weeks back). Admitted for further management. EKG unremarkable and serial troponins negative. Nuclear stress test done showing medium-sized partially fixed defect in the mid and basal inferoseptal and apical septal walls. Also shows a small mild fixed defect in the basal and midinferior wall with? Partial reversibility. EF of 49%.   Subjective:   No further Left arm pain symptoms. Complains of nonproductive cough that has been ongoing for 2 weeks   Assessment  & Plan :   Left arm pain Symptoms resolved. EKG and serial troponins negative. Appears to have atypical symptoms however stress test done cannot rule out a small area of reversible ischemia. EF of 49%. Cardiology consult pending. Patient wanting to leave home but finally convinced to see the cardiologist for further plan.  Add aspirin and beta blocker. Will add ARB.  Nonproductive cough Denies acid reflux symptoms. Added antitussives. Recently completed antibiotics for acute bronchitis.   Active Problems:   Diabetes type 2, controlled  (HCC)       Code Status : full code  Family Communication  : Wife and granddaughter at bedside  Disposition Plan  : On pending cardiology evaluation  Barriers For Discharge : ? Positive stress test. Further cardiology recommendations  Consults  :  Cardiology  Procedures  : Stress test  DVT Prophylaxis  :  Lovenox   Lab Results  Component Value Date   PLT 182 08/14/2015    Antibiotics  :    Anti-infectives  None        Objective:   Filed Vitals:   08/15/15 0958 08/15/15 1000 08/15/15 1002 08/15/15 1300  BP: 225/134 190/82 176/81 121/68  Pulse:    63  Temp:    97.6 F (36.4 C)  TempSrc:    Oral  Resp:    18  Height:      Weight:      SpO2:    97%    Wt Readings from Last 3 Encounters:  08/15/15 103.9 kg (229 lb 0.9 oz)  07/27/15 106.595 kg (235 lb)  09/23/14 110.224 kg (243 lb)     Intake/Output Summary (Last 24 hours) at 08/15/15 1822 Last data filed at 08/15/15 1400  Gross per 24 hour  Intake    240 ml  Output      0 ml  Net    240 ml     Physical Exam  Gen: not in distress HEENT: no pallor, moist mucosa, supple neck Chest: clear b/l, no added sounds CVS: N S1&S2, no murmurs, rubs or gallop GI: soft, NT, ND, BS+ Musculoskeletal: warm, no edema CNS: AAOX3, non focal    Data Review:    CBC  Recent Labs Lab 08/14/15 2106  WBC 6.6  HGB 14.5  HCT 43.7  PLT 182  MCV 90.1  MCH 29.9  MCHC 33.2  RDW 13.1  LYMPHSABS 2.2  MONOABS 0.6  EOSABS 0.3  BASOSABS 0.0    Chemistries   Recent Labs Lab 08/14/15 2106  NA 137  K 4.2  CL 103  CO2 24  GLUCOSE 155*  BUN 15  CREATININE 0.78  CALCIUM 9.4  AST 24  ALT 27  ALKPHOS 55  BILITOT 0.7   ------------------------------------------------------------------------------------------------------------------ No results for input(s): CHOL, HDL, LDLCALC, TRIG, CHOLHDL, LDLDIRECT in the last 72 hours.  Lab Results  Component Value Date   HGBA1C 5.9 01/18/2014    ------------------------------------------------------------------------------------------------------------------ No results for input(s): TSH, T4TOTAL, T3FREE, THYROIDAB in the last 72 hours.  Invalid input(s): FREET3 ------------------------------------------------------------------------------------------------------------------ No results for input(s): VITAMINB12, FOLATE, FERRITIN, TIBC, IRON, RETICCTPCT in the last 72 hours.  Coagulation profile No results for input(s): INR, PROTIME in the last 168 hours.  No results for input(s): DDIMER in the last 72 hours.  Cardiac Enzymes  Recent Labs Lab 08/15/15 0626 08/15/15 1059 08/15/15 1336  TROPONINI <0.03 <0.03 <0.03   ------------------------------------------------------------------------------------------------------------------ No results found for: BNP  Inpatient Medications  Scheduled Meds: . enoxaparin (LOVENOX) injection  40 mg Subcutaneous Q24H   Continuous Infusions:  PRN Meds:.acetaminophen, benzonatate, ondansetron (ZOFRAN) IV  Micro Results No results found for this or any previous visit (from the past 240 hour(s)).  Radiology Reports Dg Chest 2 View  08/15/2015  CLINICAL DATA:  Left arm tightness. EXAM: CHEST  2 VIEW COMPARISON:  02/02/2014 FINDINGS: The cardiomediastinal contours are normal. Minimal subsegmental atelectasis or scarring at the left lung base. No pulmonary edema. No consolidation, pleural effusion, or pneumothorax. No acute osseous abnormalities are seen. IMPRESSION: No acute pulmonary process. Electronically Signed   By: Rubye OaksMelanie  Ehinger M.D.   On: 08/15/2015 02:44   Nm Myocar Multi W/spect W/wall Motion / Ef  08/15/2015   Blood pressure demonstrated a hypertensive response to exercise.  No T wave inversion was noted during stress.  ST segment depression was noted during stress in the V5 and V6 leads, and returning to baseline after less than 1 minute of recovery.  There is a medium in  size, moderate in intensity, partially fixed defect in the mid  and basal inferoseptal and apical septal walls. This could represent variations in diaphragmatic attenuation but cannot rule out a small area of ischemia.  There is a small in size, mild, fixed defect in the basal and mid inferior wall that is partially reversible. This could represent variations in diaphragmatic attenuation but cannot rule out a small area of ischemia.  This is an intermediate risk study.  The left ventricular ejection fraction is mildly decreased (45-54%).  Nuclear stress EF: 49%.     Time Spent in minutes  20   Eddie North M.D on 08/15/2015 at 6:22 PM  Between 7am to 7pm - Pager - [phone]  After 7pm go to [email or web address] - password [facility]  [facility] -  Office  [phone]

## 2015-08-15 NOTE — ED Provider Notes (Signed)
By signing my name below, I, Doreatha Martin, attest that this documentation has been prepared under the direction and in the presence of Latonya Knight N Treylen Gibbs, DO. Electronically Signed: Doreatha Martin, ED Scribe. 08/15/2015. 1:00 AM.   TIME SEEN: 12:51 AM   CHIEF COMPLAINT:  Chief Complaint  Patient presents with  . Arm Pain     HPI:  HPI Comments: John [name] is a 75 y.o. male with h/o borderline DM, HLD, FHx CAD who presents to the Emergency Department complaining of moderate, intermittent upper left arm pain onset last night at [facility] with associated diaphoresis. Pt describes his pain as sharp, tight and states it has currently resolved. No known recent injury or trauma to the arm. No alleviating factors noted during his episodes of pain. Per pt, his pain is unaffected by movement, not worsened with rubbing the area. He reports multiple episodes of similar pain in the past, which have resolved on their own, and states he has not previously been worked up for this pain. He does note that a cardiologist and several nurses at his [facility] this evening advised them to visit the ED. He denies chest discomfort, CP, SOB, nausea, emesis, dizziness. Did have diaphoresis with this today. Pt is a non-smoker and has never smoked. FHx of heart disease from maternal Grandmother in her 62s. PCP is with [facility].  ROS: See HPI Constitutional: no fever. Positive for diaphoresis  Eyes: no drainage  ENT: no runny nose   Cardiovascular:  no chest pain, chest discomfort.   Resp: no SOB  GI: no vomiting GU: no dysuria Integumentary: no rash  Allergy: no hives  Musculoskeletal: no leg swelling. Positive for upper left arm pain (currently resolved)  Neurological: no slurred speech ROS otherwise negative  PAST MEDICAL HISTORY/PAST SURGICAL HISTORY:  Past Medical History  Diagnosis Date  . Cataract   . Hernia     umb hernia  . Hydrocele, right 10/19/2010  . Allergy   . Depression     MEDICATIONS:  Prior  to Admission medications   Medication Sig Start Date End Date Taking? Authorizing Provider  aspirin 81 MG tablet Take 81 mg by mouth daily.    Historical Provider, MD  FREESTYLE LITE test strip TEST BLOOD SUGAR DAILY PT NEEDS OFFICE VISIT 08/08/15   Elvina Sidle, MD  HYDROcodone-homatropine [facility]) 5-1.5 MG/5ML syrup Take 5 mLs by mouth every 8 (eight) hours as needed for cough. 07/27/15   Elvina Sidle, MD  levofloxacin (LEVAQUIN) 500 MG tablet Take 1 tablet (500 mg total) by mouth daily. 07/27/15   Elvina Sidle, MD    ALLERGIES:  Allergies  Allergen Reactions  . Penicillins Rash    SOCIAL HISTORY:  Social History  Substance Use Topics  . Smoking status: Never Smoker   . Smokeless tobacco: Never Used  . Alcohol Use: No    FAMILY HISTORY: Family History  Problem Relation Age of Onset  . Heart disease Mother   . Alcohol abuse Mother   . Alcohol abuse Father     EXAM: BP 136/74 mmHg  Pulse 60  Temp(Src) 98.1 F (36.7 C) (Oral)  Resp 16  SpO2 100% CONSTITUTIONAL: Alert and oriented and responds appropriately to questions. Well-appearing; well-nourished. Elderly.  HEAD: Normocephalic EYES: Conjunctivae clear, PERRL ENT: normal nose; no rhinorrhea; moist mucous membranes NECK: Supple, no meningismus, no LAD  CARD: RRR; S1 and S2 appreciated; no murmurs, no clicks, no rubs, no gallops RESP: Normal chest excursion without splinting or tachypnea; breath sounds clear  and equal bilaterally; no wheezes, no rhonchi, no rales, no hypoxia or respiratory distress, speaking full sentences ABD/GI: Normal bowel sounds; non-distended; soft, non-tender, no rebound, no guarding, no peritoneal signs BACK:  The back appears normal and is non-tender to palpation, there is no CVA tenderness EXT: Patient's left arm is nontender to palpation without bony tenderness. No erythema or warmth. Compartments are soft. 2+ radial pulses bilaterally. No pain with palpation of the arm or movement of any  of the joints. No joint effusion. Normal ROM in all joints; non-tender to palpation; no edema; normal capillary refill; no cyanosis, no calf tenderness or swelling    SKIN: Normal color for age and race; warm; no rash NEURO: Moves all extremities equally, sensation to light touch intact diffusely, cranial nerves II through XII intact PSYCH: The patient's mood and manner are appropriate. Grooming and personal hygiene are appropriate.  MEDICAL DECISION MAKING: Patient here with left arm pain and diaphoresis. Has history of borderline diabetes, hyperlipidemia and a family history of CAD. Concern for possible ACS. Currently asymptomatic. We'll give aspirin for cardioprotection. Will obtain cardiac labs, chest x-ray. EKG shows no ischemic abnormality.  ED PROGRESS: 2:55 AM  Patient is still pain-free. Troponin negative. Chest x-ray clear. PCP is with [facility]. We'll discuss with medicine for admission.   4:10 AM  FM capped for [facility].  Discussed patient's case with hospitalist, Dr. Julian ReilGardner.  Recommend admission to telemetry, observation bed.  I will place holding orders per their request. Patient and family (if present) updated with plan. Care transferred to hospitalist service.  I reviewed all nursing notes, vitals, pertinent old records, EKGs, labs, imaging (as available).    EKG Interpretation  Date/Time:  Sunday Aug 14 2015 21:03:58 EDT Ventricular Rate:  74 PR Interval:  224 QRS Duration: 98 QT Interval:  384 QTC Calculation: 426 R Axis:   77 Text Interpretation:  Sinus rhythm with 1st degree A-V block with Premature atrial complexes Otherwise normal ECG No significant change since last tracing in 2013 Confirmed by Montario Zilka,  DO, [place] [phone] on 08/15/2015 12:42:27 AM         I personally performed the services described in this documentation, which was scribed in my presence. The recorded information has been reviewed and is accurate.   Layla MawKristen N Levan Aloia, DO 08/15/15 [phone]

## 2015-08-15 NOTE — Progress Notes (Signed)
1 day lexiscan myoview completed without complication. Pending result by [facility] reader  Signed, Azalee CourseHao Vaun Hyndman PA Pager: [phone]

## 2015-08-15 NOTE — Care Management Obs Status (Signed)
MEDICARE OBSERVATION STATUS NOTIFICATION   Patient Details  Name: John [name] MRN: [id number] Date of Birth: 04-06-1940   Medicare Observation Status Notification Given:  Yes    Darrold SpanWebster, Mirelle Biskup Hall, RN 08/15/2015, 1:54 PM

## 2015-08-16 ENCOUNTER — Encounter (HOSPITAL_COMMUNITY): Payer: Self-pay | Admitting: Cardiology

## 2015-08-16 ENCOUNTER — Encounter (HOSPITAL_COMMUNITY)
Admission: EM | Disposition: A | Discharge status: Home / Self Care | Payer: Self-pay | Source: Home / Self Care | Attending: Emergency Medicine

## 2015-08-16 DIAGNOSIS — R079 Chest pain, unspecified: Secondary | ICD-10-CM | POA: Diagnosis not present

## 2015-08-16 DIAGNOSIS — R05 Cough: Secondary | ICD-10-CM

## 2015-08-16 DIAGNOSIS — M79602 Pain in left arm: Secondary | ICD-10-CM

## 2015-08-16 DIAGNOSIS — R931 Abnormal findings on diagnostic imaging of heart and coronary circulation: Secondary | ICD-10-CM

## 2015-08-16 HISTORY — PX: CARDIAC CATHETERIZATION: SHX172

## 2015-08-16 LAB — PROTIME-INR
INR: 1.09 (ref 0.00–1.49)
Prothrombin Time: 14.3 seconds (ref 11.6–15.2)

## 2015-08-16 SURGERY — LEFT HEART CATH AND CORONARY ANGIOGRAPHY
Anesthesia: LOCAL

## 2015-08-16 MED ORDER — LIDOCAINE HCL (PF) 1 % IJ SOLN
INTRAMUSCULAR | Status: AC
  Filled 2015-08-16: qty 30

## 2015-08-16 MED ORDER — SODIUM CHLORIDE 0.9% FLUSH: 3.0000 mL | INTRAVENOUS | Status: DC | PRN

## 2015-08-16 MED ORDER — HEPARIN (PORCINE) IN NACL 2-0.9 UNIT/ML-% IJ SOLN
INTRAMUSCULAR | Status: AC
  Filled 2015-08-16: qty 500

## 2015-08-16 MED ORDER — IOPAMIDOL (ISOVUE-370) INJECTION 76%
INTRAVENOUS | Status: DC | PRN
  Administered 2015-08-16: 100 mL via INTRA_ARTERIAL

## 2015-08-16 MED ORDER — SODIUM CHLORIDE 0.9 % WEIGHT BASED INFUSION
1.0000 mL/kg/h | INTRAVENOUS | Status: DC
  Administered 2015-08-16: 1 mL/kg/h via INTRAVENOUS

## 2015-08-16 MED ORDER — HEPARIN SODIUM (PORCINE) 1000 UNIT/ML IJ SOLN
INTRAMUSCULAR | Status: AC
  Filled 2015-08-16: qty 1

## 2015-08-16 MED ORDER — VERAPAMIL HCL 2.5 MG/ML IV SOLN
INTRAVENOUS | Status: DC | PRN
  Administered 2015-08-16: 10 mL via INTRA_ARTERIAL

## 2015-08-16 MED ORDER — SODIUM CHLORIDE 0.9% FLUSH
3.0000 mL | Freq: Two times a day (BID) | INTRAVENOUS | Status: DC
Start: 2015-08-16 — End: 2015-08-16

## 2015-08-16 MED ORDER — MIDAZOLAM HCL 2 MG/2ML IJ SOLN
INTRAMUSCULAR | Status: DC | PRN
  Administered 2015-08-16: 1 mg via INTRAVENOUS

## 2015-08-16 MED ORDER — MIDAZOLAM HCL 2 MG/2ML IJ SOLN
INTRAMUSCULAR | Status: AC
  Filled 2015-08-16: qty 2

## 2015-08-16 MED ORDER — SODIUM CHLORIDE 0.9% FLUSH: 3.0000 mL | Freq: Two times a day (BID) | INTRAVENOUS | Status: DC

## 2015-08-16 MED ORDER — FENTANYL CITRATE (PF) 100 MCG/2ML IJ SOLN
INTRAMUSCULAR | Status: DC | PRN
  Administered 2015-08-16: 25 ug via INTRAVENOUS

## 2015-08-16 MED ORDER — SODIUM CHLORIDE 0.9 % WEIGHT BASED INFUSION
3.0000 mL/kg/h | INTRAVENOUS | Status: AC
  Administered 2015-08-16 (×2): 3 mL/kg/h via INTRAVENOUS

## 2015-08-16 MED ORDER — HEPARIN (PORCINE) IN NACL 2-0.9 UNIT/ML-% IJ SOLN
INTRAMUSCULAR | Status: AC
  Filled 2015-08-16: qty 1000

## 2015-08-16 MED ORDER — IOPAMIDOL (ISOVUE-370) INJECTION 76%
INTRAVENOUS | Status: AC
  Filled 2015-08-16: qty 100

## 2015-08-16 MED ORDER — HEPARIN SODIUM (PORCINE) 1000 UNIT/ML IJ SOLN
INTRAMUSCULAR | Status: DC | PRN
  Administered 2015-08-16: 5000 [IU] via INTRAVENOUS

## 2015-08-16 MED ORDER — FENTANYL CITRATE (PF) 100 MCG/2ML IJ SOLN
INTRAMUSCULAR | Status: AC
  Filled 2015-08-16: qty 2

## 2015-08-16 MED ORDER — SODIUM CHLORIDE 0.9 % WEIGHT BASED INFUSION
3.0000 mL/kg/h | INTRAVENOUS | Status: DC
  Administered 2015-08-16: 3 mL/kg/h via INTRAVENOUS

## 2015-08-16 MED ORDER — MORPHINE SULFATE (PF) 2 MG/ML IV SOLN: 2.0000 mg | INTRAVENOUS | Status: DC | PRN

## 2015-08-16 MED ORDER — LIDOCAINE HCL (PF) 1 % IJ SOLN
INTRAMUSCULAR | Status: DC | PRN
  Administered 2015-08-16: 3 mL

## 2015-08-16 MED ORDER — VERAPAMIL HCL 2.5 MG/ML IV SOLN
INTRAVENOUS | Status: AC
  Filled 2015-08-16: qty 2

## 2015-08-16 MED ORDER — SODIUM CHLORIDE 0.9 % IV SOLN: 250.0000 mL | INTRAVENOUS | Status: DC | PRN

## 2015-08-16 MED ORDER — HEPARIN (PORCINE) IN NACL 2-0.9 UNIT/ML-% IJ SOLN
INTRAMUSCULAR | Status: DC | PRN
  Administered 2015-08-16: 1500 mL

## 2015-08-16 SURGICAL SUPPLY — 10 items
CATH INFINITI 5FR ANG PIGTAIL (CATHETERS) ×2 IMPLANT
CATH OPTITORQUE TIG 4.0 5F (CATHETERS) ×2 IMPLANT
DEVICE RAD COMP TR BAND LRG (VASCULAR PRODUCTS) ×2 IMPLANT
GLIDESHEATH SLEND A-KIT 6F 22G (SHEATH) ×2 IMPLANT
KIT HEART LEFT (KITS) ×2 IMPLANT
PACK CARDIAC CATHETERIZATION (CUSTOM PROCEDURE TRAY) ×2 IMPLANT
SYR MEDRAD MARK V 150ML (SYRINGE) ×2 IMPLANT
TRANSDUCER W/STOPCOCK (MISCELLANEOUS) ×2 IMPLANT
TUBING CIL FLEX 10 FLL-RA (TUBING) ×2 IMPLANT
WIRE SAFE-T 1.5MM-J .035X260CM (WIRE) ×2 IMPLANT

## 2015-08-16 NOTE — Progress Notes (Signed)
Pt has returned to 2w from cath lab. Pt has TR band on right wrist with 13cc of air. Vitals are being monitored per protocol. Telemetry box has been re-applied and CCMD has been notified. Pt is resting comfortably in bed. Will continue to monitor.   Berdine DanceLauren Moffitt RN, BSN

## 2015-08-16 NOTE — Progress Notes (Signed)
TR band has been removed from right wrist. Site is level 0. Tegaderm/gauze dressing has been applied. Vitals will be taken in 30 minutes, per protocol. Pt is resting comfortably in bed. Will continue to monitor.   Berdine DanceLauren Moffitt RN, BSN

## 2015-08-16 NOTE — Progress Notes (Signed)
Patient Name: FRIEND DORFMAN Date of Encounter: 08/16/2015  Active Problems:   Diabetes type 2, controlled (HCC)   Chest pain, moderate coronary artery risk   Left arm pain   Cough   Abnormal nuclear stress test   Primary Cardiologist: Dr. 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 He underwent Lexiscan Myoview that resulted in intermediate risk study with ST depression in V5 and V6, mid and basal inferoseptal defect. Plan is for cath today.   SUBJECTIVE: Sleeping post cath. Denies chest pain or left arm pain.    OBJECTIVE Filed Vitals:   08/16/15 0945 08/16/15 0950 08/16/15 0955 08/16/15 1020  BP: 116/69 115/73  105/65  Pulse: 65 60 0 52  Temp:      TempSrc:      Resp: 12 17 0   Height:      Weight:      SpO2: 95% 92%  96%    Intake/Output Summary (Last 24 hours) at 08/16/15 1035 Last data filed at 08/16/15 0913  Gross per 24 hour  Intake    240 ml  Output    100 ml  Net    140 ml   Filed Weights   08/15/15 0530 08/16/15 0647  Weight: 229 lb 0.9 oz (103.9 kg) 226 lb 8 oz (102.74 kg)    PHYSICAL EXAM General: Well developed, well nourished, male in no acute distress. Head: Normocephalic, atraumatic.  Neck: Supple without bruits, no JVD. Lungs:  Resp regular and unlabored, CTA. Heart: RRR, S1, S2, no S3, S4, or murmur; no rub. Abdomen: Soft, non-tender, non-distended, BS + x 4.  Extremities: No clubbing, cyanosis, No edema.  Neuro: Alert and oriented X 3. Moves all extremities spontaneously. Psych: Normal affect.  LABS: CBC: Recent Labs  08/14/15 2106  WBC 6.6  NEUTROABS 3.5  HGB 14.5  HCT 43.7  MCV 90.1  PLT 182   INR: Recent Labs  08/16/15 0704  INR 1.09   Basic Metabolic Panel: Recent Labs  08/14/15 2106  NA 137  K 4.2  CL 103  CO2 24  GLUCOSE 155*  BUN 15  CREATININE 0.78  CALCIUM 9.4   Liver  Function Tests: Recent Labs  08/14/15 2106  AST 24  ALT 27  ALKPHOS 55  BILITOT 0.7  PROT 7.1  ALBUMIN 4.1   Cardiac Enzymes: Recent Labs  08/15/15 0626 08/15/15 1059 08/15/15 1336  TROPONINI <0.03 <0.03 <0.03     Current facility-administered medications:  .  0.9 %  sodium chloride infusion, 250 mL, Intravenous, PRN, Marykay Lex, MD .  0.9% sodium chloride infusion, 3 mL/kg/hr, Intravenous, Continuous, Marykay Lex, MD, Last Rate: 308.1 mL/hr at 08/16/15 1027, 3 mL/kg/hr at 08/16/15 1027 .  acetaminophen (TYLENOL) tablet 650 mg, 650 mg, Oral, Q4H PRN, Hillary Bow, DO .  [START ON 08/17/2015] aspirin chewable tablet 81 mg, 81 mg, Oral, Daily, Nishant Dhungel, MD .  benzonatate (TESSALON) capsule 200 mg, 200 mg, Oral, TID PRN, Hillary Bow, DO, 200 mg at 08/15/15 2002 .  enoxaparin (LOVENOX) injection 40 mg, 40 mg, Subcutaneous, Q24H, Jared M Gardner, DO, 40 mg at 08/15/15 1000 .  guaiFENesin-dextromethorphan (ROBITUSSIN DM) 100-10 MG/5ML syrup 5 mL, 5 mL, Oral, Q4H PRN, Nishant Dhungel, MD, 5 mL at 08/15/15 2221 .  losartan (COZAAR) tablet 25 mg, 25 mg, Oral, Daily, Nishant Dhungel, MD, 25 mg  at 08/15/15 2002 .  metoprolol succinate (TOPROL-XL) 24 hr tablet 25 mg, 25 mg, Oral, Daily, Nishant Dhungel, MD, 25 mg at 08/15/15 2002 .  morphine 2 MG/ML injection 2 mg, 2 mg, Intravenous, Q1H PRN, Marykay Lexavid W Harding, MD .  ondansetron [facility](ZOFRAN) injection 4 mg, 4 mg, Intravenous, Q6H PRN, Hillary BowJared M Gardner, DO .  sodium chloride flush (NS) 0.9 % injection 3 mL, 3 mL, Intravenous, Q12H, Marykay Lexavid W Harding, MD .  sodium chloride flush (NS) 0.9 % injection 3 mL, 3 mL, Intravenous, PRN, Marykay Lexavid W Harding, MD . sodium chloride 3 mL/kg/hr (08/16/15 1027)    TELE: NSR       ECG: NSR  Left Heart Cath and Coronary Angiography 08/16/15 1. Ost RCA lesion, 20% stenosed. 2. The left ventricular systolic function is normal.   Angiographically minimal coronary artery disease. No lesion to  explain the abnormal stress test. Suggest false positive nuclear stress test.  Radiology/Studies: Dg Chest 2 View  08/15/2015  CLINICAL DATA:  Left arm tightness. EXAM: CHEST  2 VIEW COMPARISON:  02/02/2014 FINDINGS: The cardiomediastinal contours are normal. Minimal subsegmental atelectasis or scarring at the left lung base. No pulmonary edema. No consolidation, pleural effusion, or pneumothorax. No acute osseous abnormalities are seen. IMPRESSION: No acute pulmonary process. Electronically Signed   By: Rubye OaksMelanie  Ehinger M.D.   On: 08/15/2015 02:44   Nm Myocar Multi W/spect W/wall Motion / Ef  08/15/2015   Blood pressure demonstrated a hypertensive response to exercise.  No T wave inversion was noted during stress.  ST segment depression was noted during stress in the V5 and V6 leads, and returning to baseline after less than 1 minute of recovery.  There is a medium in size, moderate in intensity, partially fixed defect in the mid and basal inferoseptal and apical septal walls. This could represent variations in diaphragmatic attenuation but cannot rule out a small area of ischemia.  There is a small in size, mild, fixed defect in the basal and mid inferior wall that is partially reversible. This could represent variations in diaphragmatic attenuation but cannot rule out a small area of ischemia.  This is an intermediate risk study.  The left ventricular ejection fraction is mildly decreased (45-54%).  Nuclear stress EF: 49%.      Current Medications:  . [START ON 08/17/2015] aspirin  81 mg Oral Daily  . enoxaparin (LOVENOX) injection  40 mg Subcutaneous Q24H  . losartan  25 mg Oral Daily  . metoprolol succinate  25 mg Oral Daily  . sodium chloride flush  3 mL Intravenous Q12H   . sodium chloride 3 mL/kg/hr (08/16/15 1027)    ASSESSMENT AND PLAN: Active Problems:   Diabetes type 2, controlled (HCC)   Chest pain, moderate coronary artery risk   Left arm pain   Cough   Abnormal  nuclear stress test  1. Atypical chest pain/left arm pain: Patient underwent Lexiscan Myoview after having intermittent left arm pain. Test resulted as intermediate risk, and patient has a family history of CAD. He went for left heart catheterization today, no obstructive CAD. Right radial site is stable. Continue metoprolol daily.   2. DM: management per primary team  3. HTN: Well controlled on ARB and beta blocker.   4. HLD: Last LDL was 90 in 2015, would recommend rechecking in outpatient setting.    Signed, Little IshikawaErin E Smith , NP 10:35 AM 08/16/2015 Pager [phone] Patient seen and examined and history reviewed. Agree with above findings and plan. Cath data  reviewed with patient. Stable for DC today from our standpoint once radial band comes off.   Peter Sw[place], MDFACC 08/16/2015 11:44 AM

## 2015-08-16 NOTE — Progress Notes (Signed)
Pt has been discharged home with wife. CCMD was notified and telemetry box was removed. IV was removed with no complications. Pt and pt's wife received discharge instructions and all questions were answered. Pt left the unit via wheelchair and was accompanied by a Charity fundraiserN. Pt was in no distress at time of discharge.  Berdine DanceLauren Moffitt RN, BSN

## 2015-08-16 NOTE — Discharge Summary (Signed)
Physician Discharge Summary  John [name] [id number] DOB: 13-Jun-1940 DOA: 08/14/2015  PCP: Elvina Sidle, MD  Admit date: 08/14/2015 Discharge date: 08/16/2015  Time spent: 25 minutes  Recommendations for Outpatient Follow-up:  Discharge home with outpatient PCP follow-up  Discharge Diagnoses:  Left arm pain,  Active Problems:   Diabetes type 2, controlled (HCC)   Cough   Discharge Condition: Fair  Diet recommendation: Diabetic  Filed Weights   08/15/15 0530 08/16/15 0647  Weight: 103.9 kg (229 lb 0.9 oz) 102.74 kg (226 lb 8 oz)    History of present illness:  75 year old male with history of CAD, borderline diabetes mellitus and hyperlipidemia presented with intermittent left upper arm pain associated with diaphoresis 88 he was also having nonproductive cough for the past 2 weeks (treated with Levaquin for acute bronchitis 2 weeks back). Admitted for further management. EKG unremarkable and serial troponins negative. Nuclear stress test done showing medium-sized partially fixed defect in the mid and basal inferoseptal and apical septal walls. Also shows a small mild fixed defect in the basal and midinferior wall with? Partial reversibility. EF of 49%.  Hospital Course:  Left arm pain Symptoms resolved. EKG and serial troponins negative. Appears to have atypical symptoms however stress test done cannot rule out a small area of reversible ischemia. EF of 49%. Seen by cardiology and underwent left heart catheterization which showed clean coronaries with normal LVEF. Appear to have false-positive stress test. Patient stable post heart catheterization and can be discharged home with outpatient follow-up.  Nonproductive cough Denies acid reflux symptoms. Added antitussives. Recently completed antibiotics for acute bronchitis.  Active Problems:  Diabetes type 2, controlled (HCC)       Code Status : full code  Family Communication : Wife and granddaughter at  bedside  Disposition Plan : On pending cardiology evaluation  Barriers For Discharge : ? Positive stress test. Further cardiology recommendations  Consults : Cardiology  Procedures : Stress test  DVT Prophylaxis : Lovenox    Recent Labs    Lab Results  Component Value Date   PLT 182 08/14/2015      Antibiotics :   Anti-infectives    None         Discharge Exam: Filed Vitals:   08/16/15 1116 08/16/15 1131  BP: 108/57 113/66  Pulse: 57 53  Temp:    Resp:      Gen: not in distress HEENT: no pallor, moist mucosa, supple neck Chest: clear b/l, no added sounds CVS: N S1&S2, no murmurs, rubs or gallop GI: soft, NT, ND, BS+ Musculoskeletal: warm, no edema, right radial cath site is clean. CNS: AAOX3, non focal  Discharge Instructions    Current Discharge Medication List    START taking these medications   Details  guaiFENesin (MUCINEX) 600 MG 12 hr tablet Take 1 tablet (600 mg total) by mouth 2 (two) times daily. Qty: 10 tablet, Refills: 0    guaiFENesin-dextromethorphan (ROBITUSSIN DM) 100-10 MG/5ML syrup Take 5 mLs by mouth every 4 (four) hours as needed for cough. Qty: 118 mL, Refills: 0      CONTINUE these medications which have NOT CHANGED   Details  aspirin 81 MG tablet Take 81 mg by mouth daily.      STOP taking these medications     FREESTYLE LITE test strip      HYDROcodone-homatropine (HYCODAN) 5-1.5 MG/5ML syrup      levofloxacin (LEVAQUIN) 500 MG tablet        Allergies  Allergen Reactions  .  Penicillins Rash   Follow-up Information    Follow up with Elvina SidleLAUENSTEIN,KURT, MD. Schedule an appointment as soon as possible for a visit in 1 week.   Specialty:  Family Medicine   Contact information:   [address] [place] [address] [phone]        The results of significant diagnostics from this hospitalization (including imaging, microbiology, ancillary and laboratory) are listed below for reference.     Significant Diagnostic Studies: Dg Chest 2 View  08/15/2015  CLINICAL DATA:  Left arm tightness. EXAM: CHEST  2 VIEW COMPARISON:  02/02/2014 FINDINGS: The cardiomediastinal contours are normal. Minimal subsegmental atelectasis or scarring at the left lung base. No pulmonary edema. No consolidation, pleural effusion, or pneumothorax. No acute osseous abnormalities are seen. IMPRESSION: No acute pulmonary process. Electronically Signed   By: Rubye OaksMelanie  Ehinger M.D.   On: 08/15/2015 02:44   Nm Myocar Multi W/spect W/wall Motion / Ef  08/15/2015   Blood pressure demonstrated a hypertensive response to exercise.  No T wave inversion was noted during stress.  ST segment depression was noted during stress in the V5 and V6 leads, and returning to baseline after less than 1 minute of recovery.  There is a medium in size, moderate in intensity, partially fixed defect in the mid and basal inferoseptal and apical septal walls. This could represent variations in diaphragmatic attenuation but cannot rule out a small area of ischemia.  There is a small in size, mild, fixed defect in the basal and mid inferior wall that is partially reversible. This could represent variations in diaphragmatic attenuation but cannot rule out a small area of ischemia.  This is an intermediate risk study.  The left ventricular ejection fraction is mildly decreased (45-54%).  Nuclear stress EF: 49%.     Microbiology: No results found for this or any previous visit (from the past 240 hour(s)).   Labs: Basic Metabolic Panel:  Recent Labs Lab 08/14/15 2106  NA 137  K 4.2  CL 103  CO2 24  GLUCOSE 155*  BUN 15  CREATININE 0.78  CALCIUM 9.4   Liver Function Tests:  Recent Labs Lab 08/14/15 2106  AST 24  ALT 27  ALKPHOS 55  BILITOT 0.7  PROT 7.1  ALBUMIN 4.1   No results for input(s): LIPASE, AMYLASE in the last 168 hours. No results for input(s): AMMONIA in the last 168 hours. CBC:  Recent Labs Lab  08/14/15 2106  WBC 6.6  NEUTROABS 3.5  HGB 14.5  HCT 43.7  MCV 90.1  PLT 182   Cardiac Enzymes:  Recent Labs Lab 08/15/15 0053 08/15/15 0626 08/15/15 1059 08/15/15 1336  TROPONINI <0.03 <0.03 <0.03 <0.03   BNP: BNP (last 3 results) No results for input(s): BNP in the last 8760 hours.  ProBNP (last 3 results) No results for input(s): PROBNP in the last 8760 hours.  CBG: No results for input(s): GLUCAP in the last 168 hours.     Signed:  Eddie NorthHUNGEL, Croy Drumwright MD.  [facility] 08/16/2015, 11:50 AM

## 2015-08-16 NOTE — H&P (View-Only) (Signed)
CARDIOLOGY CONSULT NOTE   Patient ID: CHEVEZ SAMBRANO MRN: [id number], DOB/AGE: 1940-09-17   Admit date: 08/14/2015 Date of Consult: 08/15/2015   Primary Physician: Elvina Sidle, MD Primary Cardiologist: Dr. Charlton Haws (2010)  Pt. 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  Problem List  Past Medical History  Diagnosis Date  . Cataract   . Umbilical hernia   . Hydrocele, right 10/19/2010  . Seasonal allergies   . Depression   . Borderline diabetes mellitus     Past Surgical History  Procedure Laterality Date  . Eye surgery    . Umbilical hernia repair  12/18/10    Mesh     Allergies  Allergies  Allergen Reactions  . 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 He says his maternal grandmother had CAD later in life. He reports exertional dyspnea symptoms. According to his wife, he has at least 2 or 3 episodes of left arm pain in the past few weeks. His arm pain tended to occur at rest but he has also had some with exertion. He dug a ditch last Thursday, reportedly without any discomfort. He was at church on Sunday 08/14/2015 when he is started having left arm pain again, and it lasted several minutes before resolving. He was also diaphoretic. He says there are several nurses and doctors who go to church with him recommended him to seek medical attention. He was admitted to hospitalist service overnight, serial troponin was negative. EKG showed no significant ST-T wave changes.   He underwent Myoview on 08/15/2015 which came back abnormal with EF 45-54%, 1 mm upsloping ST depression in V5 and V6 that resolved less than 1 minute into recovery, he had hypertensive response to exercise, medium sized moderate intensity  partially fixed defect in mid and basal inferoseptal and apical septal wall which could represent variation in diaphragmatic attenuation but cannot rule out small area of ischemia, small in size, mild fixed defect in the basal and mid inferior wall that is partially reversible. Cardiology formally consulted for abnormal nuclear study.  Inpatient Medications  . [START ON 08/16/2015] aspirin  81 mg Oral Daily  . enoxaparin (LOVENOX) injection  40 mg Subcutaneous Q24H  . losartan  25 mg Oral Daily  . metoprolol succinate  25 mg Oral Daily    Family History Family History  Problem Relation Age of Onset  . Heart disease Mother   . Alcohol abuse Mother   . Alcohol abuse Father      Social History Social History   Social History  . Marital Status: Married    Spouse Name: N/A  . Number of Children: N/A  . Years of Education: N/A   Occupational History  . Not on file.   Social History Main Topics  . Smoking status: Never Smoker   . Smokeless tobacco: Never Used  . Alcohol Use: No  . Drug Use: No  . Sexual Activity: Not on file   Other Topics Concern  . Not on file   Social History Narrative     Review of Systems  General:  No chills, fever, night sweats or weight changes.  Cardiovascular:  No chest pain, dyspnea on exertion, edema, orthopnea, palpitations, paroxysmal nocturnal dyspnea. +left arm pain Dermatological: No rash, lesions/masses  Respiratory: No cough, dyspnea Urologic: No hematuria, dysuria Abdominal:   No nausea, vomiting, diarrhea, bright red blood per rectum, melena, or hematemesis Neurologic:  No visual changes, wkns, changes in mental status. All other systems reviewed and are otherwise negative except as noted above.  Physical Exam  Blood pressure 121/68, pulse 63, temperature 97.6 F (36.4 C), temperature source Oral, resp. rate 18, height 6\' 2"  (1.88 m), weight 229 lb 0.9 oz (103.9 kg), SpO2 97 %.  General: Pleasant, NAD Psych: Normal  affect. Neuro: Alert and oriented X 3. Moves all extremities spontaneously. HEENT: Normal  Neck: Supple without bruits or JVD. Lungs:  Resp regular and unlabored, CTA. Heart: RRR no s3, s4, or murmurs. Abdomen: Soft, non-tender, non-distended, BS + x 4.  Extremities: No clubbing, cyanosis or edema. DP/PT/Radials 2+ and equal bilaterally.  Labs   Recent Labs  08/15/15 0053 08/15/15 0626 08/15/15 1059 08/15/15 1336  TROPONINI <0.03 <0.03 <0.03 <0.03   Lab Results  Component Value Date   WBC 6.6 08/14/2015   HGB 14.5 08/14/2015   HCT 43.7 08/14/2015   MCV 90.1 08/14/2015   PLT 182 08/14/2015     Recent Labs Lab 08/14/15 2106  NA 137  K 4.2  CL 103  CO2 24  BUN 15  CREATININE 0.78  CALCIUM 9.4  PROT 7.1  BILITOT 0.7  ALKPHOS 55  ALT 27  AST 24  GLUCOSE 155*   Lab Results  Component Value Date   CHOL 152 01/18/2014   HDL 36* 01/18/2014   LDLCALC 90 01/18/2014   TRIG 131 01/18/2014   No results found for: DDIMER  Radiology/Studies  Dg Chest 2 View  08/15/2015  CLINICAL DATA:  Left arm tightness. EXAM: CHEST  2 VIEW COMPARISON:  02/02/2014 FINDINGS: The cardiomediastinal contours are normal. Minimal subsegmental atelectasis or scarring at the left lung base. No pulmonary edema. No consolidation, pleural effusion, or pneumothorax. No acute osseous abnormalities are seen. IMPRESSION: No acute pulmonary process. Electronically Signed   By: Rubye Oaks M.D.   On: 08/15/2015 02:44   Nm Myocar Multi W/spect W/wall Motion / Ef  08/15/2015   Blood pressure demonstrated a hypertensive response to exercise.  No T wave inversion was noted during stress.  ST segment depression was noted during stress in the V5 and V6 leads, and returning to baseline after less than 1 minute of recovery.  There is a medium in size, moderate in intensity, partially fixed defect in the mid and basal inferoseptal and apical septal walls. This could represent variations in diaphragmatic  attenuation but cannot rule out a small area of ischemia.  There is a small in size, mild, fixed defect in the basal and mid inferior wall that is partially reversible. This could represent variations in diaphragmatic attenuation but cannot rule out a small area of ischemia.  This is an intermediate risk study.  The left ventricular ejection fraction is mildly decreased (45-54%).  Nuclear stress EF: 49%.    ECG  NSR without significant ST-T wave changes  ASSESSMENT AND PLAN  1. L arm pain and DOE with abnormal myoview  - symptoms have both typical and atypical features, somewhat atypical, Cardiac risk factors include CAD in her maternal grandmother's side and borderline diabetes mellitus.  - Myoview abnormal, will discuss further workup with MD  2. Prediabetes: per IM  Signed, Azalee Course, PA-C 08/15/2015, 7:19 PM   Attending note:  Patient seen and examined as on call physician this evening. Reviewed records and discussed the  case with Mr. Eyvonne LeftMeng PA-C. 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 He has had at least 3 episodes of left arm discomfort in the last few weeks, most recently at rest, including a prolonged episode that occurred at church and was associated with diaphoresis. He has a family history of CAD on his maternal grandmother's side, also personal history of borderline diabetes mellitus. He was admitted to the hospital yesterday for observation, has ruled out for myocardial infarction with negative troponin I levels, ECG overall nonspecific. Wife states that he had 2 episodes of recurrent left arm discomfort late yesterday when he was awaiting admission. He underwent an exercise Myoview today as arranged by the internal medicine service. Study is reported above, overall intermediate risk with potential  inferior and small region of apical anteroseptal ischemia, LVEF 49%, abnormal ECG with hypertensive response.  On examination this evening he appears comfortable without active left arm discomfort. Blood pressure 121/68, heart rate in the 60s. Lungs clear without labored breathing, cardiac exam reveals RRR without gallop, no peripheral edema. Lab work shows creatinine 0.7, hemoglobin 14.5, platelets 182. Chest x-ray reports no acute process. Chart indicates prior followup with Dr. Eden EmmsNishan several years ago and a reported normal cardiac catheterization around 2000.  Patient presents with dyspnea on exertion and intermittent left arm discomfort with atypical features. No evidence of ACS by cardiac enzymes, however screening exercise Myoview study was intermediate risk abnormal. I have discussed this with the patient, his wife, and grandson present. Recommendation is a diagnostic cardiac catheterization to clarify the situation further and help guide treatment options. After discussing the potential risks and anticipated benefits, he is in agreement to proceed, and this is being scheduled for tomorrow.  Jonelle SidleSamuel G. Veida Spira, M.D., F.A.C.C.

## 2015-08-16 NOTE — Interval H&P Note (Signed)
History and Physical Interval Note:  08/16/2015 8:45 AM  John [name]  has presented today for surgery, with the diagnosis of unstable angina with abnormal Myoview stress test. The various methods of treatment have been discussed with the patient and family. After consideration of risks, benefits and other options for treatment, the patient has consented to  Procedure(s): Left Heart Cath and Coronary Angiography (N/A) with possible Percutaneous Coronary Intervention as a surgical intervention .  The patient's history has been reviewed, patient examined, no change in status, stable for surgery.  I have reviewed the patient's chart and labs.  Questions were answered to the patient's satisfaction.    Cath Lab Visit (complete for each Cath Lab visit)  Clinical Evaluation Leading to the Procedure:   ACS: No.  Non-ACS:    Anginal Classification: CCS II  Anti-ischemic medical therapy: Minimal Therapy (1 class of medications)  Non-Invasive Test Results: Intermediate-risk stress test findings: cardiac mortality 1-3%/year  Prior CABG: No previous CABG  Ischemic Symptoms? CCS II (Slight limitation of ordinary activity) Anti-ischemic Medical Therapy? Minimal Therapy (1 class of medications) Non-invasive Test Results? Intermediate-risk stress test findings: cardiac mortality 1-3%/year Prior CABG? No Previous CABG   Patient Information:   1-2V CAD, no prox LAD  U (5)  Indication: 16; Score: 5   Patient Information:   CTO of 1 vessel, no other CAD  U (4)  Indication: 26; Score: 4   Patient Information:   1V CAD with prox LAD  U (6)  Indication: 32; Score: 6   Patient Information:   2V-CAD with prox LAD  A (7)  Indication: 38; Score: 7   Patient Information:   3V-CAD without LMCA  A (7)  Indication: 44; Score: 7   Patient Information:   3V-CAD without LMCA With Abnormal LV systolic function  A (9)  Indication: 48; Score: 9   Patient Information:    LMCA-CAD  A (9)  Indication: 49; Score: 9   Patient Information:   2V-CAD with prox LAD PCI  A (7)  Indication: 62; Score: 7   Patient Information:   2V-CAD with prox LAD CABG  A (8)  Indication: 62; Score: 8   Patient Information:   3V-CAD without LMCA With Low CAD burden(i.e., 3 focal stenoses, low SYNTAX score) PCI  A (7)  Indication: 63; Score: 7   Patient Information:   3V-CAD without LMCA With Low CAD burden(i.e., 3 focal stenoses, low SYNTAX score) CABG  A (9)  Indication: 63; Score: 9   Patient Information:   3V-CAD without LMCA E06c - Intermediate-high CAD burden (i.e., multiple diffuse lesions, presence of CTO, or high SYNTAX score) PCI  U (4)  Indication: 64; Score: 4   Patient Information:   3V-CAD without LMCA E06c - Intermediate-high CAD burden (i.e., multiple diffuse lesions, presence of CTO, or high SYNTAX score) CABG  A (9)  Indication: 64; Score: 9   Patient Information:   LMCA-CAD With Isolated LMCA stenosis  PCI  U (6)  Indication: 65; Score: 6   Patient Information:   LMCA-CAD With Isolated LMCA stenosis  CABG  A (9)  Indication: 65; Score: 9   Patient Information:   LMCA-CAD Additional CAD, low CAD burden (i.e., 1- to 2-vessel additional involvement, low SYNTAX score) PCI  U (5)  Indication: 66; Score: 5   Patient Information:   LMCA-CAD Additional CAD, low CAD burden (i.e., 1- to 2-vessel additional involvement, low SYNTAX score) CABG  A (9)  Indication: 66; Score: 9  Patient Information:   LMCA-CAD Additional CAD, intermediate-high CAD burden (i.e., 3-vessel involvement, presence of CTO, or high SYNTAX score) PCI  I (3)  Indication: 67; Score: 3   Patient Information:   LMCA-CAD Additional CAD, intermediate-high CAD burden (i.e., 3-vessel involvement, presence of CTO, or high SYNTAX score) CABG  A (9)  Indication: 67; Score: 9   Bryan Lemmaavid Naser Schuld, MD

## 2015-08-30 IMAGING — CR DG CHEST 2V
2 series · 2 of 2 positions shown · non-contrast
Comparison: August 23, 2013

CLINICAL DATA: Cough and fever

EXAM:
CHEST  2 VIEW

[PA]
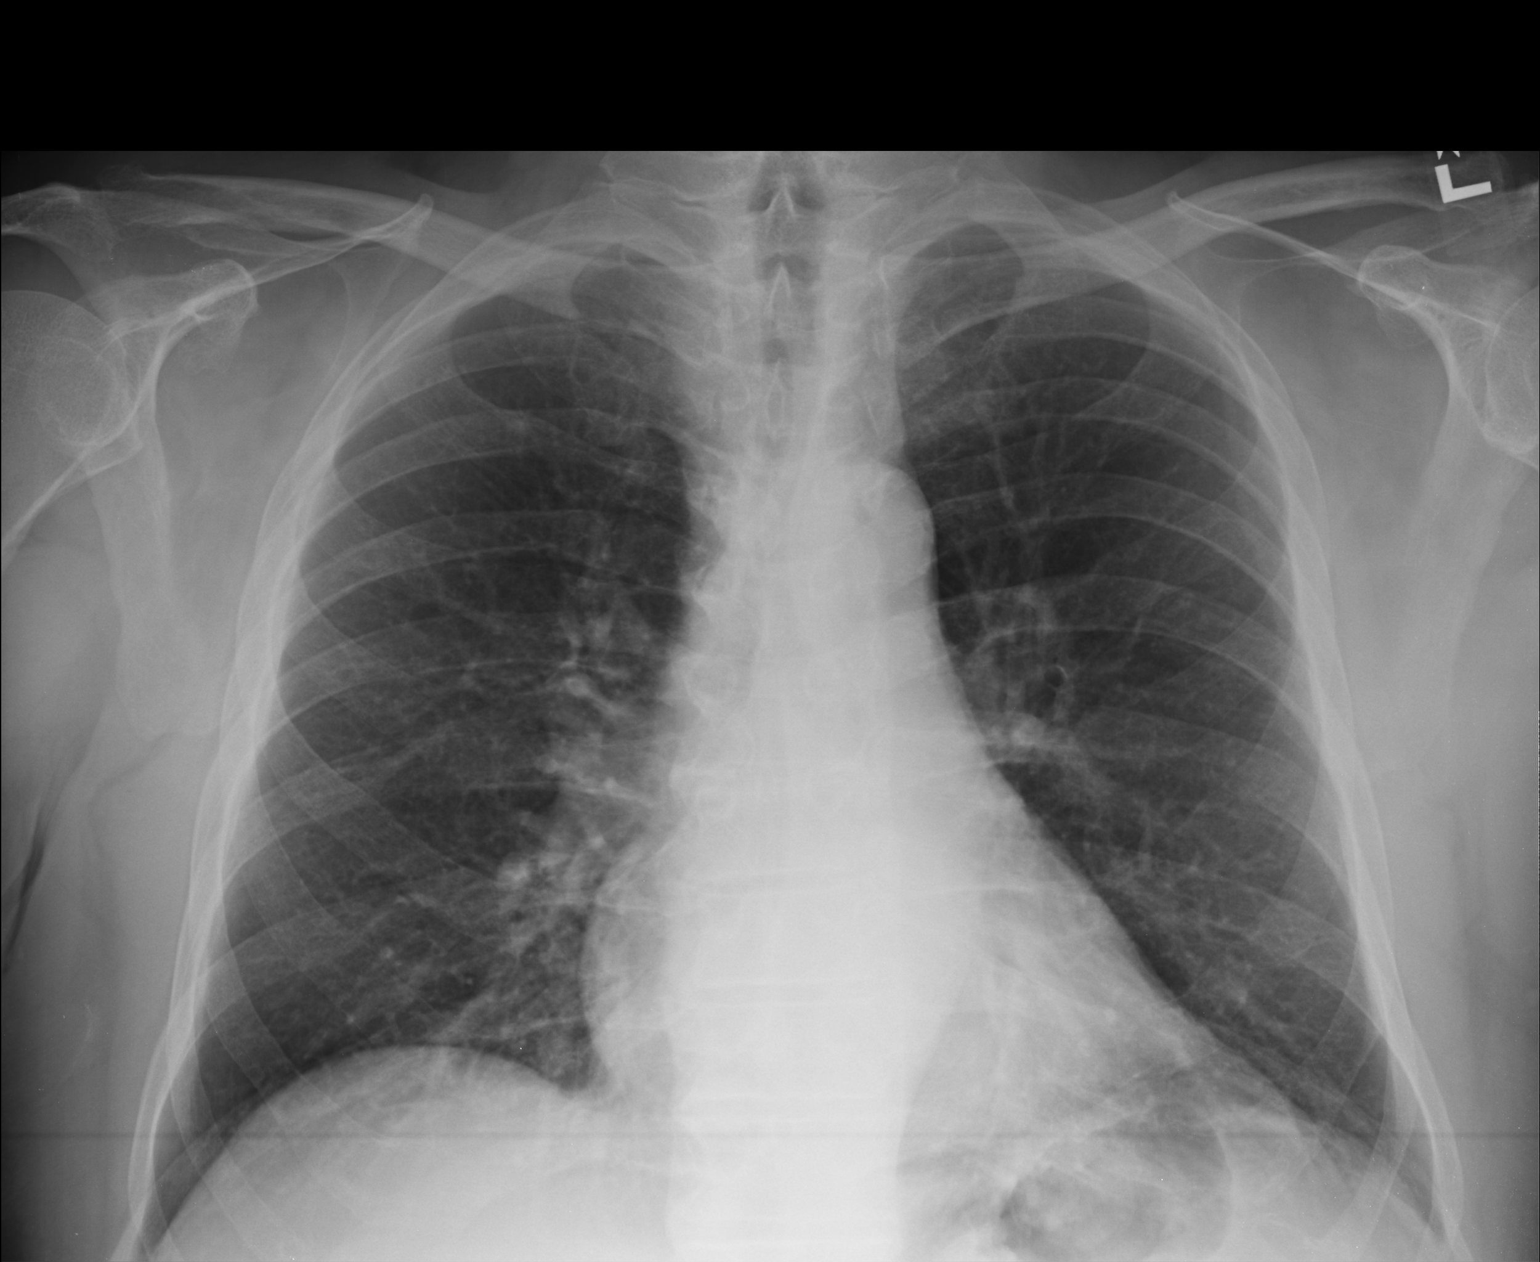

[lateral]
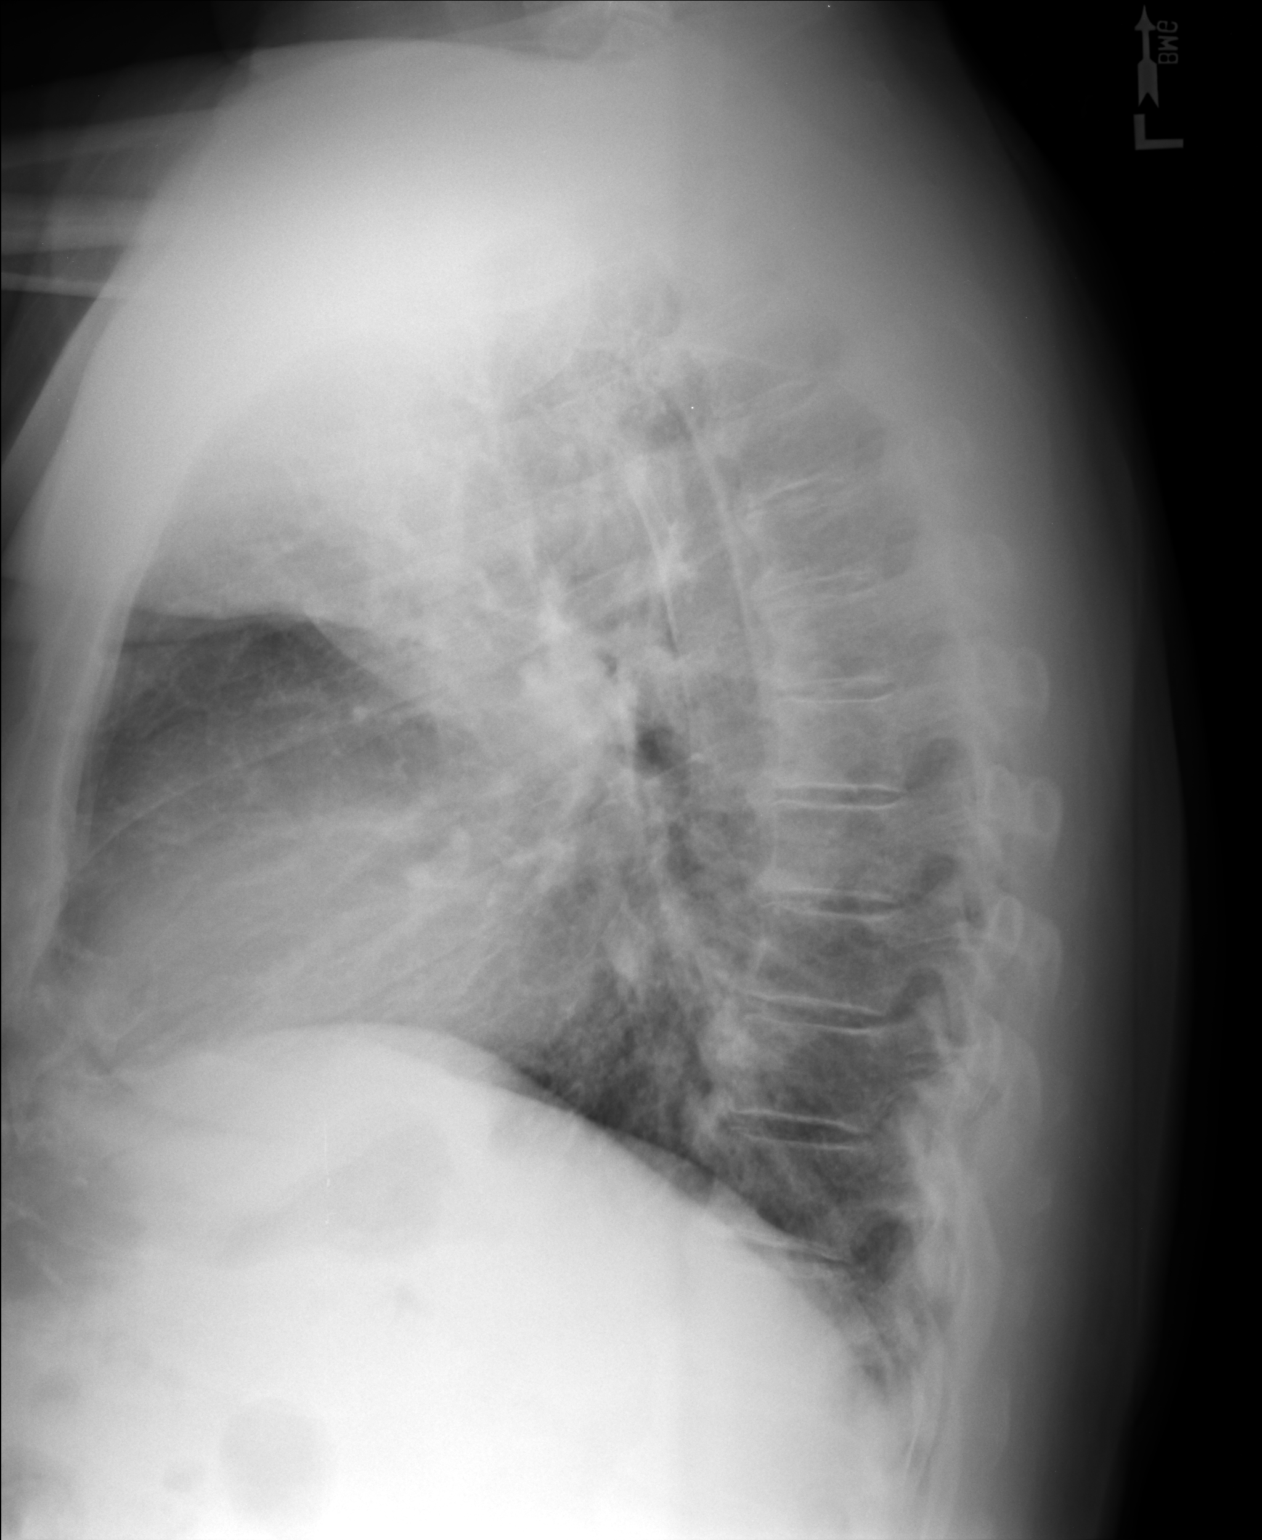

[2 of 2 positions shown; findings below may reference images not displayed]

FINDINGS: There is no edema or consolidation. The heart size and pulmonary
vascularity are normal. No adenopathy. There is degenerative change
in the thoracic spine.
IMPRESSION: No edema or consolidation.

## 2016-06-20 ENCOUNTER — Ambulatory Visit (INDEPENDENT_AMBULATORY_CARE_PROVIDER_SITE_OTHER): Payer: PPO

## 2016-06-20 ENCOUNTER — Ambulatory Visit (INDEPENDENT_AMBULATORY_CARE_PROVIDER_SITE_OTHER): Payer: PPO | Admitting: Emergency Medicine

## 2016-06-20 VITALS — BP 142/75 | HR 76 | Temp 98.1°F | Resp 16 | Ht 74.0 in | Wt 232.2 lb

## 2016-06-20 DIAGNOSIS — M791 Myalgia: Secondary | ICD-10-CM

## 2016-06-20 DIAGNOSIS — R109 Unspecified abdominal pain: Secondary | ICD-10-CM

## 2016-06-20 DIAGNOSIS — M7918 Myalgia, other site: Secondary | ICD-10-CM | POA: Insufficient documentation

## 2016-06-20 LAB — POCT URINALYSIS DIP (MANUAL ENTRY)
BILIRUBIN UA: NEGATIVE
Glucose, UA: NEGATIVE
Ketones, POC UA: NEGATIVE
LEUKOCYTES UA: NEGATIVE
NITRITE UA: NEGATIVE
PH UA: 6.5 (ref 5.0–8.0)
Spec Grav, UA: 1.02 (ref 1.030–1.035)
UROBILINOGEN UA: 1 (ref ?–2.0)

## 2016-06-20 MED ORDER — HYDROCODONE-ACETAMINOPHEN 5-325 MG PO TABS: 1.0000 | ORAL_TABLET | Freq: Four times a day (QID) | ORAL | 0 refills | Status: AC | PRN

## 2016-06-20 NOTE — Progress Notes (Signed)
[address] 76 y.o.   Chief Complaint  Patient presents with  . Back Pain    HISTORY OF PRESENT ILLNESS: This is a 75 y.o. 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   Back Pain  This is a new problem. The current episode started 1 to 4 weeks ago. The problem occurs constantly. The problem has been waxing and waning since onset. Pain location: right flank. The quality of the pain is described as aching. The pain does not radiate. The pain is at a severity of 6/10. The pain is moderate. The symptoms are aggravated by position, bending and twisting. Pertinent negatives include no abdominal pain, bladder incontinence, bowel incontinence, chest pain, dysuria, fever, headaches, leg pain, numbness, paresis, paresthesias, pelvic pain, perianal numbness, tingling, weakness or weight loss. Risk factors: none.     Prior to Admission medications   Medication Sig Start Date End Date Taking? Authorizing Provider  aspirin 81 MG tablet Take 81 mg by mouth daily.   Yes Historical Provider, MD  guaiFENesin-dextromethorphan (ROBITUSSIN DM) 100-10 MG/5ML syrup Take 5 mLs by mouth every 4 (four) hours as needed for cough. Patient not taking: Reported on 06/20/2016 08/15/15   Theda Belfast Dhungel, MD  HYDROcodone-acetaminophen (NORCO) 5-325 MG tablet Take 1 tablet by mouth every 6 (six) hours as needed for moderate pain. 06/20/16   Georgina Quint, MD    Allergies  Allergen Reactions  . Penicillins Rash    Patient Active Problem List   Diagnosis Date Noted  . Chest pain, moderate coronary artery risk 08/15/2015  . Left arm pain 08/15/2015  . Cough 08/15/2015  . Diabetes type 2, controlled (HCC) 05/12/2013  . Dyslipidemia 05/12/2013  . Umbilical hernia 10/19/2010  . Hydrocele, right 10/19/2010    Past Medical History:  Diagnosis Date  .  Borderline diabetes mellitus   . Cataract   . Depression   . Hydrocele, right 10/19/2010  . Seasonal allergies   . Umbilical hernia     Past Surgical History:  Procedure Laterality Date  . CARDIAC CATHETERIZATION N/A 08/16/2015   Procedure: Left Heart Cath and Coronary Angiography;  Surgeon: Marykay Lex, MD;  Location: [facility] INVASIVE CV LAB;  Service: Cardiovascular;  Laterality: N/A;  . EYE SURGERY    . UMBILICAL HERNIA REPAIR  12/18/10   Mesh    Social History   Social History  . Marital status: Married    Spouse name: N/A  . Number of children: N/A  . Years of education: N/A   Occupational History  . Not on file.   Social History Main Topics  . Smoking status: Never Smoker  . Smokeless tobacco: Never Used  . Alcohol use No  . Drug use: No  . Sexual activity: Not on file   Other Topics Concern  . Not on file   Social History Narrative  . No narrative on file    Family History  Problem Relation Age of Onset  . Heart disease Mother   . Alcohol abuse Mother   . Alcohol abuse Father      Review of Systems  Constitutional: Negative for fever and weight loss.  HENT: Negative.  Negative for congestion, nosebleeds and sore throat.   Eyes: Negative.   Respiratory: Negative.  Negative for cough, hemoptysis, shortness of breath and wheezing.   Cardiovascular: Negative for chest pain and palpitations.  Gastrointestinal: Negative for abdominal pain, bowel incontinence, diarrhea, nausea and vomiting.  Genitourinary: Positive for flank pain (right side). Negative for bladder incontinence, dysuria, frequency, hematuria, pelvic pain and urgency.  Musculoskeletal: Positive for back pain. Negative for myalgias.  Skin: Negative.  Negative for rash.  Neurological: Negative for dizziness, tingling, weakness, numbness, headaches and paresthesias.  Endo/Heme/Allergies: Negative.   All other systems reviewed and are negative.  Vitals:   06/20/16 1227  BP: (!) 142/75  Pulse:  76  Resp: 16  Temp: 98.1 F (36.7 C)     Physical Exam  Constitutional: He is oriented to person, place, and time. He appears well-developed and well-nourished.  HENT:  Head: Normocephalic and atraumatic.  Nose: Nose normal.  Mouth/Throat: Oropharynx is clear and moist. No oropharyngeal exudate.  Eyes: Conjunctivae and EOM are normal. Pupils are equal, round, and reactive to light.  Neck: Normal range of motion. Neck supple. No JVD present. No thyromegaly present.  Cardiovascular: Normal rate, regular rhythm and normal heart sounds.   Pulmonary/Chest: Effort normal and breath sounds normal.  Abdominal: Soft. Bowel sounds are normal. He exhibits no distension and no mass. There is no tenderness. There is CVA tenderness (right). There is no guarding.  Musculoskeletal: Normal range of motion.  Lymphadenopathy:    He has no cervical adenopathy.  Neurological: He is alert and oriented to person, place, and time. No sensory deficit. He exhibits normal muscle tone.  Skin: Skin is warm and dry. Capillary refill takes less than 2 seconds. No rash noted.  Psychiatric: He has a normal mood and affect. His behavior is normal.  Vitals reviewed.  Xrays reviewed: NAD  ASSESSMENT & PLAN: John [name] was seen today for back pain.  Diagnoses and all orders for this visit:  Right flank pain -     CBC with Differential/Platelet -     Comprehensive metabolic panel -     POCT urinalysis dipstick -     DG Abd Acute W/Chest; Future -     US Renal; Future  Musculoskeletal pain  Other orders -     HYDROcodone-acetaminophen (NORCO) 5-325 MG tablet; Take 1 tablet by mouth every 6 (six) hours as needed for moderate pain.   Patient Instructions       IF you received an x-ray today, you will receive an invoice from [facility]. Please contact [facility] at [phone] with questions or concerns regarding your invoice.   IF you received labwork today, you will receive an invoice  from [facility]. Please contact [facility] at [phone] with questions or concerns regarding your invoice.   Our billing staff will not be able to assist you with questions regarding bills from these companies.  You will be contacted with the lab results as soon as they are available. The fastest way to get your results is to activate your My Chart account. Instructions are located on the last page of this paperwork. If you have not heard from us regarding the results in 2 weeks, please contact this office.     Flank Pain Flank pain is pain in your side. The flank is the area of your side between your upper belly (abdomen) and your back. The pain may occur over a short period of time (acute) or may be long-term or come back often (chronic). It may be mild or very bad. Pain in this area can be caused by many different things. Follow these instructions at home:  Rest as told by your doctor.  Drink enough fluid to  keep your pee (urine) clear or pale yellow.  Take over-the-counter and prescription medicines only as told by your doctor.  Keep all follow-up visits as told by your doctor. This is important. Contact a doctor if:  Medicine does not help your pain.  You have new symptoms.  Your pain gets worse.  You have a fever.  Your symptoms last longer than 2-3 days. Get help right away if:  Your tummy hurts or is swollen.  You are short of breath.  You feel sick to your stomach (nauseous) and it does not go away.  You cannot stop throwing up (vomiting).  You feel like you will pass out or you do pass out (faint).  You have blood in your pee.  You have a fever and your symptoms suddenly get worse. This information is not intended to replace advice given to you by your health care provider. Make sure you discuss any questions you have with your health care provider. Document Released: 12/20/2007 Document Revised: 12/02/2015 Document Reviewed: 12/14/2014 Elsevier Interactive  Patient Education  2017 Elsevier Inc.      Edwina Barth, MD [facility]

## 2016-06-20 NOTE — Patient Instructions (Addendum)
     IF you received an x-ray today, you will receive an invoice from [facility]. Please contact [facility] at [phone] with questions or concerns regarding your invoice.   IF you received labwork today, you will receive an invoice from [facility]. Please contact [facility] at [phone] with questions or concerns regarding your invoice.   Our billing staff will not be able to assist you with questions regarding bills from these companies.  You will be contacted with the lab results as soon as they are available. The fastest way to get your results is to activate your My Chart account. Instructions are located on the last page of this paperwork. If you have not heard from us regarding the results in 2 weeks, please contact this office.     Flank Pain Flank pain is pain in your side. The flank is the area of your side between your upper belly (abdomen) and your back. The pain may occur over a short period of time (acute) or may be long-term or come back often (chronic). It may be mild or very bad. Pain in this area can be caused by many different things. Follow these instructions at home:  Rest as told by your doctor.  Drink enough fluid to keep your pee (urine) clear or pale yellow.  Take over-the-counter and prescription medicines only as told by your doctor.  Keep all follow-up visits as told by your doctor. This is important. Contact a doctor if:  Medicine does not help your pain.  You have new symptoms.  Your pain gets worse.  You have a fever.  Your symptoms last longer than 2-3 days. Get help right away if:  Your tummy hurts or is swollen.  You are short of breath.  You feel sick to your stomach (nauseous) and it does not go away.  You cannot stop throwing up (vomiting).  You feel like you will pass out or you do pass out (faint).  You have blood in your pee.  You have a fever and your symptoms suddenly get worse. This information is not  intended to replace advice given to you by your health care provider. Make sure you discuss any questions you have with your health care provider. Document Released: 12/20/2007 Document Revised: 12/02/2015 Document Reviewed: 12/14/2014 Elsevier Interactive Patient Education  2017 [employer]

## 2016-06-21 LAB — CBC WITH DIFFERENTIAL/PLATELET
BASOS ABS: 0 10*3/uL (ref 0.0–0.2)
Basos: 0 %
EOS (ABSOLUTE): 0.2 10*3/uL (ref 0.0–0.4)
EOS: 3 %
HEMATOCRIT: 44.4 % (ref 37.5–51.0)
HEMOGLOBIN: 15.2 g/dL (ref 13.0–17.7)
IMMATURE GRANULOCYTES: 0 %
Immature Grans (Abs): 0 10*3/uL (ref 0.0–0.1)
Lymphocytes Absolute: 1.4 10*3/uL (ref 0.7–3.1)
Lymphs: 24 %
MCH: 30.1 pg (ref 26.6–33.0)
MCHC: 34.2 g/dL (ref 31.5–35.7)
MCV: 88 fL (ref 79–97)
Monocytes Absolute: 0.6 10*3/uL (ref 0.1–0.9)
Monocytes: 10 %
NEUTROS PCT: 63 %
Neutrophils Absolute: 3.7 10*3/uL (ref 1.4–7.0)
Platelets: 185 10*3/uL (ref 150–379)
RBC: 5.05 x10E6/uL (ref 4.14–5.80)
RDW: 14.4 % (ref 12.3–15.4)
WBC: 5.9 10*3/uL (ref 3.4–10.8)

## 2016-06-21 LAB — COMPREHENSIVE METABOLIC PANEL
ALBUMIN: 4.7 g/dL (ref 3.5–4.8)
ALT: 22 IU/L (ref 0–44)
AST: 18 IU/L (ref 0–40)
Albumin/Globulin Ratio: 1.7 (ref 1.2–2.2)
Alkaline Phosphatase: 58 IU/L (ref 39–117)
BUN/Creatinine Ratio: 25 — ABNORMAL HIGH (ref 10–24)
BUN: 19 mg/dL (ref 8–27)
Bilirubin Total: 0.4 mg/dL (ref 0.0–1.2)
CALCIUM: 9.4 mg/dL (ref 8.6–10.2)
CO2: 21 mmol/L (ref 18–29)
CREATININE: 0.75 mg/dL — AB (ref 0.76–1.27)
Chloride: 100 mmol/L (ref 96–106)
GFR calc Af Amer: 104 mL/min/{1.73_m2} (ref >59)
GFR, EST NON AFRICAN AMERICAN: 90 mL/min/{1.73_m2} (ref >59)
GLOBULIN, TOTAL: 2.8 g/dL (ref 1.5–4.5)
GLUCOSE: 82 mg/dL (ref 65–99)
Potassium: 4.6 mmol/L (ref 3.5–5.2)
SODIUM: 139 mmol/L (ref 134–144)
Total Protein: 7.5 g/dL (ref 6.0–8.5)

## 2016-06-22 ENCOUNTER — Encounter: Payer: Self-pay | Admitting: *Deleted

## 2016-06-29 ENCOUNTER — Ambulatory Visit (HOSPITAL_COMMUNITY)
Admission: RE | Admit: 2016-06-29 | Discharge: 2016-06-29 | Disposition: A | Discharge status: Home / Self Care | Payer: PPO | Source: Ambulatory Visit | Attending: Emergency Medicine | Admitting: Emergency Medicine

## 2016-06-29 DIAGNOSIS — R109 Unspecified abdominal pain: Secondary | ICD-10-CM | POA: Diagnosis not present

## 2016-06-30 ENCOUNTER — Telehealth: Payer: Self-pay | Admitting: Radiology

## 2016-06-30 NOTE — Telephone Encounter (Signed)
I have called patient to advise [place] is normal left message for him to call us back

## 2016-06-30 NOTE — Telephone Encounter (Signed)
-----   Message from [facility], MD sent at 06/29/2016  1:48 PM EDT ----- Normal results. Call patient and inform about results.

## 2016-06-30 NOTE — Telephone Encounter (Signed)
I spoke to patient regarding [place] results

## 2016-06-30 NOTE — Telephone Encounter (Signed)
Patient states his back is very painful. I called to advise the Renal US is normal. He would like to know what is next in the plan of care, please advise

## 2016-07-02 NOTE — Telephone Encounter (Signed)
I recommend follow up with Orthopedist.

## 2016-07-03 NOTE — Telephone Encounter (Signed)
We just need an ortho referral placed please.

## 2016-07-04 ENCOUNTER — Telehealth: Payer: Self-pay | Admitting: Family Medicine

## 2016-07-04 ENCOUNTER — Other Ambulatory Visit: Payer: Self-pay | Admitting: Emergency Medicine

## 2016-07-04 DIAGNOSIS — M545 Low back pain, unspecified: Secondary | ICD-10-CM

## 2016-07-04 NOTE — Telephone Encounter (Signed)
Pt is needing to know what to do next he is still having symptoms   Best number [phone]

## 2016-07-04 NOTE — Telephone Encounter (Signed)
done

## 2016-07-05 NOTE — Telephone Encounter (Signed)
Needs Ortho evaluation x flank/back pain. Kidneys are not the cause of the pain. Incidental liver fatty infiltration finding in the [place]/S (with normal liver enzymes) is not the cause of his pain but he can also follow up with GI for that.

## 2016-07-05 NOTE — Telephone Encounter (Signed)
Please see u/s and advise

## 2016-07-06 NOTE — Telephone Encounter (Signed)
Wife advised Has an appt. In may with ortho

## 2016-07-06 NOTE — Telephone Encounter (Signed)
Could we try to get him in sooner somewhere

## 2016-07-09 NOTE — Telephone Encounter (Signed)
Sending to [facility] as they have earlier openings.

## 2016-08-21 ENCOUNTER — Ambulatory Visit (INDEPENDENT_AMBULATORY_CARE_PROVIDER_SITE_OTHER): Payer: PPO | Admitting: Orthopaedic Surgery

## 2017-01-02 DIAGNOSIS — L03119 Cellulitis of unspecified part of limb: Secondary | ICD-10-CM | POA: Diagnosis not present

## 2017-01-02 DIAGNOSIS — L723 Sebaceous cyst: Secondary | ICD-10-CM | POA: Diagnosis not present

## 2017-01-24 DIAGNOSIS — L723 Sebaceous cyst: Secondary | ICD-10-CM | POA: Diagnosis not present

## 2017-08-07 DIAGNOSIS — J0100 Acute maxillary sinusitis, unspecified: Secondary | ICD-10-CM | POA: Diagnosis not present

## 2017-08-14 DIAGNOSIS — H02055 Trichiasis without entropian left lower eyelid: Secondary | ICD-10-CM | POA: Diagnosis not present

## 2017-08-21 ENCOUNTER — Encounter: Payer: Self-pay | Admitting: Family Medicine

## 2018-01-15 DIAGNOSIS — H903 Sensorineural hearing loss, bilateral: Secondary | ICD-10-CM | POA: Diagnosis not present

## 2018-02-14 DIAGNOSIS — H35372 Puckering of macula, left eye: Secondary | ICD-10-CM | POA: Diagnosis not present

## 2018-02-14 DIAGNOSIS — H01022 Squamous blepharitis right lower eyelid: Secondary | ICD-10-CM | POA: Diagnosis not present

## 2018-02-14 DIAGNOSIS — H01025 Squamous blepharitis left lower eyelid: Secondary | ICD-10-CM | POA: Diagnosis not present

## 2018-02-14 DIAGNOSIS — H01024 Squamous blepharitis left upper eyelid: Secondary | ICD-10-CM | POA: Diagnosis not present

## 2018-02-14 DIAGNOSIS — Z961 Presence of intraocular lens: Secondary | ICD-10-CM | POA: Diagnosis not present

## 2018-02-14 DIAGNOSIS — H01021 Squamous blepharitis right upper eyelid: Secondary | ICD-10-CM | POA: Diagnosis not present

## 2018-06-16 DIAGNOSIS — J209 Acute bronchitis, unspecified: Secondary | ICD-10-CM | POA: Diagnosis not present

## 2019-04-09 DIAGNOSIS — R531 Weakness: Secondary | ICD-10-CM | POA: Diagnosis not present

## 2019-04-09 DIAGNOSIS — R000 Tachycardia, unspecified: Secondary | ICD-10-CM | POA: Diagnosis not present

## 2019-04-09 DIAGNOSIS — J988 Other specified respiratory disorders: Secondary | ICD-10-CM | POA: Diagnosis not present

## 2019-04-09 DIAGNOSIS — U071 COVID-19: Secondary | ICD-10-CM | POA: Diagnosis not present

## 2019-04-13 DIAGNOSIS — Z20828 Contact with and (suspected) exposure to other viral communicable diseases: Secondary | ICD-10-CM | POA: Diagnosis not present

## 2019-07-05 DIAGNOSIS — R55 Syncope and collapse: Secondary | ICD-10-CM | POA: Diagnosis not present

## 2019-07-05 DIAGNOSIS — R001 Bradycardia, unspecified: Secondary | ICD-10-CM | POA: Diagnosis not present

## 2019-07-05 DIAGNOSIS — R000 Tachycardia, unspecified: Secondary | ICD-10-CM | POA: Diagnosis not present

## 2019-07-06 DIAGNOSIS — R000 Tachycardia, unspecified: Secondary | ICD-10-CM | POA: Diagnosis not present

## 2019-07-09 DIAGNOSIS — R55 Syncope and collapse: Secondary | ICD-10-CM | POA: Diagnosis not present

## 2019-07-14 DIAGNOSIS — L708 Other acne: Secondary | ICD-10-CM | POA: Diagnosis not present

## 2019-09-19 DIAGNOSIS — R402413 Glasgow coma scale score 13-15, at hospital admission: Secondary | ICD-10-CM | POA: Diagnosis not present

## 2019-09-19 DIAGNOSIS — R451 Restlessness and agitation: Secondary | ICD-10-CM | POA: Diagnosis not present

## 2019-09-19 DIAGNOSIS — R45851 Suicidal ideations: Secondary | ICD-10-CM | POA: Diagnosis not present

## 2019-09-19 DIAGNOSIS — I998 Other disorder of circulatory system: Secondary | ICD-10-CM | POA: Diagnosis not present

## 2019-09-19 DIAGNOSIS — F0391 Unspecified dementia with behavioral disturbance: Secondary | ICD-10-CM | POA: Diagnosis not present

## 2019-09-19 DIAGNOSIS — J019 Acute sinusitis, unspecified: Secondary | ICD-10-CM | POA: Diagnosis not present

## 2019-09-19 DIAGNOSIS — I440 Atrioventricular block, first degree: Secondary | ICD-10-CM | POA: Diagnosis not present

## 2019-09-19 DIAGNOSIS — R9431 Abnormal electrocardiogram [ECG] [EKG]: Secondary | ICD-10-CM | POA: Diagnosis not present

## 2019-09-19 DIAGNOSIS — F0281 Dementia in other diseases classified elsewhere with behavioral disturbance: Secondary | ICD-10-CM | POA: Diagnosis not present

## 2019-09-19 DIAGNOSIS — I739 Peripheral vascular disease, unspecified: Secondary | ICD-10-CM | POA: Diagnosis not present

## 2019-09-19 DIAGNOSIS — I6789 Other cerebrovascular disease: Secondary | ICD-10-CM | POA: Diagnosis not present

## 2019-09-19 DIAGNOSIS — G930 Cerebral cysts: Secondary | ICD-10-CM | POA: Diagnosis not present

## 2019-09-19 DIAGNOSIS — G3109 Other frontotemporal dementia: Secondary | ICD-10-CM | POA: Diagnosis not present

## 2019-09-19 DIAGNOSIS — R456 Violent behavior: Secondary | ICD-10-CM | POA: Diagnosis not present

## 2019-09-19 DIAGNOSIS — Z20822 Contact with and (suspected) exposure to covid-19: Secondary | ICD-10-CM | POA: Diagnosis not present

## 2019-09-20 DIAGNOSIS — F0391 Unspecified dementia with behavioral disturbance: Secondary | ICD-10-CM | POA: Diagnosis not present

## 2019-09-20 DIAGNOSIS — R9431 Abnormal electrocardiogram [ECG] [EKG]: Secondary | ICD-10-CM | POA: Diagnosis not present

## 2019-09-20 DIAGNOSIS — R451 Restlessness and agitation: Secondary | ICD-10-CM | POA: Diagnosis not present

## 2019-09-20 DIAGNOSIS — F0151 Vascular dementia with behavioral disturbance: Secondary | ICD-10-CM | POA: Diagnosis not present

## 2019-09-20 DIAGNOSIS — I6789 Other cerebrovascular disease: Secondary | ICD-10-CM | POA: Diagnosis not present

## 2019-09-20 DIAGNOSIS — Z20822 Contact with and (suspected) exposure to covid-19: Secondary | ICD-10-CM | POA: Diagnosis not present

## 2019-09-20 DIAGNOSIS — I440 Atrioventricular block, first degree: Secondary | ICD-10-CM | POA: Diagnosis not present

## 2019-09-20 DIAGNOSIS — R456 Violent behavior: Secondary | ICD-10-CM | POA: Diagnosis not present

## 2019-09-20 DIAGNOSIS — R402413 Glasgow coma scale score 13-15, at hospital admission: Secondary | ICD-10-CM | POA: Diagnosis not present

## 2019-09-21 DIAGNOSIS — W19XXXA Unspecified fall, initial encounter: Secondary | ICD-10-CM | POA: Diagnosis not present

## 2019-09-21 DIAGNOSIS — I959 Hypotension, unspecified: Secondary | ICD-10-CM | POA: Diagnosis not present

## 2019-09-21 DIAGNOSIS — R402413 Glasgow coma scale score 13-15, at hospital admission: Secondary | ICD-10-CM | POA: Diagnosis not present

## 2019-09-21 DIAGNOSIS — Z66 Do not resuscitate: Secondary | ICD-10-CM | POA: Diagnosis not present

## 2019-09-21 DIAGNOSIS — S022XXD Fracture of nasal bones, subsequent encounter for fracture with routine healing: Secondary | ICD-10-CM | POA: Diagnosis not present

## 2019-09-21 DIAGNOSIS — F0391 Unspecified dementia with behavioral disturbance: Secondary | ICD-10-CM | POA: Diagnosis not present

## 2019-09-21 DIAGNOSIS — E43 Unspecified severe protein-calorie malnutrition: Secondary | ICD-10-CM | POA: Diagnosis not present

## 2019-09-21 DIAGNOSIS — N3001 Acute cystitis with hematuria: Secondary | ICD-10-CM | POA: Diagnosis not present

## 2019-09-21 DIAGNOSIS — F0151 Vascular dementia with behavioral disturbance: Secondary | ICD-10-CM | POA: Diagnosis not present

## 2019-09-21 DIAGNOSIS — S0993XA Unspecified injury of face, initial encounter: Secondary | ICD-10-CM | POA: Diagnosis not present

## 2019-09-21 DIAGNOSIS — E876 Hypokalemia: Secondary | ICD-10-CM | POA: Diagnosis not present

## 2019-09-21 DIAGNOSIS — Z7189 Other specified counseling: Secondary | ICD-10-CM | POA: Diagnosis not present

## 2019-09-21 DIAGNOSIS — R55 Syncope and collapse: Secondary | ICD-10-CM | POA: Diagnosis not present

## 2019-09-21 DIAGNOSIS — Y95 Nosocomial condition: Secondary | ICD-10-CM | POA: Diagnosis not present

## 2019-09-21 DIAGNOSIS — Z515 Encounter for palliative care: Secondary | ICD-10-CM | POA: Diagnosis not present

## 2019-09-21 DIAGNOSIS — W19XXXS Unspecified fall, sequela: Secondary | ICD-10-CM | POA: Diagnosis not present

## 2019-09-21 DIAGNOSIS — K5900 Constipation, unspecified: Secondary | ICD-10-CM | POA: Diagnosis not present

## 2019-09-21 DIAGNOSIS — Z8679 Personal history of other diseases of the circulatory system: Secondary | ICD-10-CM | POA: Diagnosis not present

## 2019-09-21 DIAGNOSIS — B370 Candidal stomatitis: Secondary | ICD-10-CM | POA: Diagnosis not present

## 2019-09-21 DIAGNOSIS — J189 Pneumonia, unspecified organism: Secondary | ICD-10-CM | POA: Diagnosis not present

## 2019-09-21 DIAGNOSIS — Z9181 History of falling: Secondary | ICD-10-CM | POA: Diagnosis not present

## 2019-09-21 DIAGNOSIS — R7303 Prediabetes: Secondary | ICD-10-CM | POA: Diagnosis not present

## 2019-09-21 DIAGNOSIS — W19XXXD Unspecified fall, subsequent encounter: Secondary | ICD-10-CM | POA: Diagnosis not present

## 2019-09-21 DIAGNOSIS — R456 Violent behavior: Secondary | ICD-10-CM | POA: Diagnosis not present

## 2019-09-21 DIAGNOSIS — U071 COVID-19: Secondary | ICD-10-CM | POA: Diagnosis not present

## 2019-09-21 DIAGNOSIS — I10 Essential (primary) hypertension: Secondary | ICD-10-CM | POA: Diagnosis not present

## 2019-09-21 DIAGNOSIS — E639 Nutritional deficiency, unspecified: Secondary | ICD-10-CM | POA: Diagnosis not present

## 2019-09-21 DIAGNOSIS — F0281 Dementia in other diseases classified elsewhere with behavioral disturbance: Secondary | ICD-10-CM | POA: Diagnosis not present

## 2019-09-21 DIAGNOSIS — I6789 Other cerebrovascular disease: Secondary | ICD-10-CM | POA: Diagnosis not present

## 2019-09-21 DIAGNOSIS — Z79899 Other long term (current) drug therapy: Secondary | ICD-10-CM | POA: Diagnosis not present

## 2019-09-21 DIAGNOSIS — B9620 Unspecified Escherichia coli [E. coli] as the cause of diseases classified elsewhere: Secondary | ICD-10-CM | POA: Diagnosis not present

## 2019-09-21 DIAGNOSIS — S022XXA Fracture of nasal bones, initial encounter for closed fracture: Secondary | ICD-10-CM | POA: Diagnosis not present

## 2019-09-21 DIAGNOSIS — L219 Seborrheic dermatitis, unspecified: Secondary | ICD-10-CM | POA: Diagnosis not present

## 2019-09-21 DIAGNOSIS — N390 Urinary tract infection, site not specified: Secondary | ICD-10-CM | POA: Diagnosis not present

## 2019-09-21 DIAGNOSIS — B351 Tinea unguium: Secondary | ICD-10-CM | POA: Diagnosis not present

## 2019-09-21 DIAGNOSIS — R9431 Abnormal electrocardiogram [ECG] [EKG]: Secondary | ICD-10-CM | POA: Diagnosis not present

## 2019-09-21 DIAGNOSIS — Z2821 Immunization not carried out because of patient refusal: Secondary | ICD-10-CM | POA: Diagnosis not present

## 2019-09-21 DIAGNOSIS — Z23 Encounter for immunization: Secondary | ICD-10-CM | POA: Diagnosis not present

## 2019-09-21 DIAGNOSIS — Y92239 Unspecified place in hospital as the place of occurrence of the external cause: Secondary | ICD-10-CM | POA: Diagnosis not present

## 2019-09-21 DIAGNOSIS — Z20822 Contact with and (suspected) exposure to covid-19: Secondary | ICD-10-CM | POA: Diagnosis not present

## 2019-09-21 DIAGNOSIS — B3781 Candidal esophagitis: Secondary | ICD-10-CM | POA: Diagnosis not present

## 2019-09-21 DIAGNOSIS — E871 Hypo-osmolality and hyponatremia: Secondary | ICD-10-CM | POA: Diagnosis not present

## 2019-09-21 DIAGNOSIS — I440 Atrioventricular block, first degree: Secondary | ICD-10-CM | POA: Diagnosis not present

## 2019-09-21 DIAGNOSIS — R1311 Dysphagia, oral phase: Secondary | ICD-10-CM | POA: Diagnosis not present

## 2019-09-21 DIAGNOSIS — R451 Restlessness and agitation: Secondary | ICD-10-CM | POA: Diagnosis not present

## 2019-09-21 DIAGNOSIS — R1310 Dysphagia, unspecified: Secondary | ICD-10-CM | POA: Diagnosis not present

## 2019-09-21 DIAGNOSIS — G3109 Other frontotemporal dementia: Secondary | ICD-10-CM | POA: Diagnosis not present

## 2019-10-28 DIAGNOSIS — F0391 Unspecified dementia with behavioral disturbance: Secondary | ICD-10-CM | POA: Diagnosis not present

## 2019-10-30 DIAGNOSIS — F0391 Unspecified dementia with behavioral disturbance: Secondary | ICD-10-CM | POA: Diagnosis not present

## 2019-10-31 DIAGNOSIS — L219 Seborrheic dermatitis, unspecified: Secondary | ICD-10-CM | POA: Diagnosis not present

## 2019-10-31 DIAGNOSIS — F0391 Unspecified dementia with behavioral disturbance: Secondary | ICD-10-CM | POA: Diagnosis not present

## 2019-10-31 DIAGNOSIS — R55 Syncope and collapse: Secondary | ICD-10-CM | POA: Diagnosis not present

## 2019-10-31 DIAGNOSIS — K5900 Constipation, unspecified: Secondary | ICD-10-CM | POA: Diagnosis not present

## 2019-10-31 DIAGNOSIS — R1310 Dysphagia, unspecified: Secondary | ICD-10-CM | POA: Diagnosis not present

## 2019-10-31 DIAGNOSIS — R1311 Dysphagia, oral phase: Secondary | ICD-10-CM | POA: Diagnosis not present

## 2019-11-01 DIAGNOSIS — F0391 Unspecified dementia with behavioral disturbance: Secondary | ICD-10-CM | POA: Diagnosis not present

## 2019-11-02 DIAGNOSIS — E876 Hypokalemia: Secondary | ICD-10-CM | POA: Diagnosis not present

## 2019-11-02 DIAGNOSIS — K5900 Constipation, unspecified: Secondary | ICD-10-CM | POA: Diagnosis not present

## 2019-11-02 DIAGNOSIS — R55 Syncope and collapse: Secondary | ICD-10-CM | POA: Diagnosis not present

## 2019-11-02 DIAGNOSIS — L219 Seborrheic dermatitis, unspecified: Secondary | ICD-10-CM | POA: Diagnosis not present

## 2019-11-02 DIAGNOSIS — F0391 Unspecified dementia with behavioral disturbance: Secondary | ICD-10-CM | POA: Diagnosis not present

## 2019-11-02 DIAGNOSIS — Z8679 Personal history of other diseases of the circulatory system: Secondary | ICD-10-CM | POA: Diagnosis not present

## 2019-11-02 DIAGNOSIS — E871 Hypo-osmolality and hyponatremia: Secondary | ICD-10-CM | POA: Diagnosis not present

## 2019-11-02 DIAGNOSIS — R1311 Dysphagia, oral phase: Secondary | ICD-10-CM | POA: Diagnosis not present

## 2019-11-02 DIAGNOSIS — I959 Hypotension, unspecified: Secondary | ICD-10-CM | POA: Diagnosis not present

## 2019-11-02 DIAGNOSIS — Z20822 Contact with and (suspected) exposure to covid-19: Secondary | ICD-10-CM | POA: Diagnosis not present

## 2019-11-02 DIAGNOSIS — R7303 Prediabetes: Secondary | ICD-10-CM | POA: Diagnosis not present

## 2019-11-03 DIAGNOSIS — F0391 Unspecified dementia with behavioral disturbance: Secondary | ICD-10-CM | POA: Diagnosis not present

## 2019-11-04 DIAGNOSIS — Z8679 Personal history of other diseases of the circulatory system: Secondary | ICD-10-CM | POA: Diagnosis not present

## 2019-11-04 DIAGNOSIS — R7303 Prediabetes: Secondary | ICD-10-CM | POA: Diagnosis not present

## 2019-11-04 DIAGNOSIS — Z20822 Contact with and (suspected) exposure to covid-19: Secondary | ICD-10-CM | POA: Diagnosis not present

## 2019-11-04 DIAGNOSIS — R1311 Dysphagia, oral phase: Secondary | ICD-10-CM | POA: Diagnosis not present

## 2019-11-04 DIAGNOSIS — E876 Hypokalemia: Secondary | ICD-10-CM | POA: Diagnosis not present

## 2019-11-04 DIAGNOSIS — I959 Hypotension, unspecified: Secondary | ICD-10-CM | POA: Diagnosis not present

## 2019-11-04 DIAGNOSIS — E871 Hypo-osmolality and hyponatremia: Secondary | ICD-10-CM | POA: Diagnosis not present

## 2019-11-04 DIAGNOSIS — K5900 Constipation, unspecified: Secondary | ICD-10-CM | POA: Diagnosis not present

## 2019-11-04 DIAGNOSIS — R55 Syncope and collapse: Secondary | ICD-10-CM | POA: Diagnosis not present

## 2019-11-04 DIAGNOSIS — L219 Seborrheic dermatitis, unspecified: Secondary | ICD-10-CM | POA: Diagnosis not present

## 2019-11-04 DIAGNOSIS — F0391 Unspecified dementia with behavioral disturbance: Secondary | ICD-10-CM | POA: Diagnosis not present

## 2019-11-05 DIAGNOSIS — F0391 Unspecified dementia with behavioral disturbance: Secondary | ICD-10-CM | POA: Diagnosis not present

## 2019-11-05 DIAGNOSIS — E871 Hypo-osmolality and hyponatremia: Secondary | ICD-10-CM | POA: Diagnosis not present

## 2019-11-05 DIAGNOSIS — R7303 Prediabetes: Secondary | ICD-10-CM | POA: Diagnosis not present

## 2019-11-05 DIAGNOSIS — R55 Syncope and collapse: Secondary | ICD-10-CM | POA: Diagnosis not present

## 2019-11-05 DIAGNOSIS — R1311 Dysphagia, oral phase: Secondary | ICD-10-CM | POA: Diagnosis not present

## 2019-11-05 DIAGNOSIS — Z7189 Other specified counseling: Secondary | ICD-10-CM | POA: Diagnosis not present

## 2019-11-05 DIAGNOSIS — Z20822 Contact with and (suspected) exposure to covid-19: Secondary | ICD-10-CM | POA: Diagnosis not present

## 2019-11-05 DIAGNOSIS — E876 Hypokalemia: Secondary | ICD-10-CM | POA: Diagnosis not present

## 2019-11-05 DIAGNOSIS — R1310 Dysphagia, unspecified: Secondary | ICD-10-CM | POA: Diagnosis not present

## 2019-11-05 DIAGNOSIS — I959 Hypotension, unspecified: Secondary | ICD-10-CM | POA: Diagnosis not present

## 2019-11-05 DIAGNOSIS — Z8679 Personal history of other diseases of the circulatory system: Secondary | ICD-10-CM | POA: Diagnosis not present

## 2019-11-05 DIAGNOSIS — L219 Seborrheic dermatitis, unspecified: Secondary | ICD-10-CM | POA: Diagnosis not present

## 2019-11-05 DIAGNOSIS — K5900 Constipation, unspecified: Secondary | ICD-10-CM | POA: Diagnosis not present

## 2019-11-06 DIAGNOSIS — K5900 Constipation, unspecified: Secondary | ICD-10-CM | POA: Diagnosis not present

## 2019-11-06 DIAGNOSIS — E876 Hypokalemia: Secondary | ICD-10-CM | POA: Diagnosis not present

## 2019-11-06 DIAGNOSIS — E871 Hypo-osmolality and hyponatremia: Secondary | ICD-10-CM | POA: Diagnosis not present

## 2019-11-06 DIAGNOSIS — R7303 Prediabetes: Secondary | ICD-10-CM | POA: Diagnosis not present

## 2019-11-06 DIAGNOSIS — I959 Hypotension, unspecified: Secondary | ICD-10-CM | POA: Diagnosis not present

## 2019-11-06 DIAGNOSIS — Z8679 Personal history of other diseases of the circulatory system: Secondary | ICD-10-CM | POA: Diagnosis not present

## 2019-11-06 DIAGNOSIS — R1311 Dysphagia, oral phase: Secondary | ICD-10-CM | POA: Diagnosis not present

## 2019-11-06 DIAGNOSIS — R55 Syncope and collapse: Secondary | ICD-10-CM | POA: Diagnosis not present

## 2019-11-06 DIAGNOSIS — F0391 Unspecified dementia with behavioral disturbance: Secondary | ICD-10-CM | POA: Diagnosis not present

## 2019-11-06 DIAGNOSIS — L219 Seborrheic dermatitis, unspecified: Secondary | ICD-10-CM | POA: Diagnosis not present

## 2019-11-06 DIAGNOSIS — Z20822 Contact with and (suspected) exposure to covid-19: Secondary | ICD-10-CM | POA: Diagnosis not present

## 2019-11-07 DIAGNOSIS — F0391 Unspecified dementia with behavioral disturbance: Secondary | ICD-10-CM | POA: Diagnosis not present

## 2019-11-08 DIAGNOSIS — E871 Hypo-osmolality and hyponatremia: Secondary | ICD-10-CM | POA: Diagnosis not present

## 2019-11-08 DIAGNOSIS — R7303 Prediabetes: Secondary | ICD-10-CM | POA: Diagnosis not present

## 2019-11-08 DIAGNOSIS — F0391 Unspecified dementia with behavioral disturbance: Secondary | ICD-10-CM | POA: Diagnosis not present

## 2019-11-08 DIAGNOSIS — R55 Syncope and collapse: Secondary | ICD-10-CM | POA: Diagnosis not present

## 2019-11-08 DIAGNOSIS — I959 Hypotension, unspecified: Secondary | ICD-10-CM | POA: Diagnosis not present

## 2019-11-08 DIAGNOSIS — Z2821 Immunization not carried out because of patient refusal: Secondary | ICD-10-CM | POA: Diagnosis not present

## 2019-11-08 DIAGNOSIS — Z8679 Personal history of other diseases of the circulatory system: Secondary | ICD-10-CM | POA: Diagnosis not present

## 2019-11-08 DIAGNOSIS — K5900 Constipation, unspecified: Secondary | ICD-10-CM | POA: Diagnosis not present

## 2019-11-08 DIAGNOSIS — B351 Tinea unguium: Secondary | ICD-10-CM | POA: Diagnosis not present

## 2019-11-08 DIAGNOSIS — R1311 Dysphagia, oral phase: Secondary | ICD-10-CM | POA: Diagnosis not present

## 2019-11-08 DIAGNOSIS — E876 Hypokalemia: Secondary | ICD-10-CM | POA: Diagnosis not present

## 2019-11-08 DIAGNOSIS — Z20822 Contact with and (suspected) exposure to covid-19: Secondary | ICD-10-CM | POA: Diagnosis not present

## 2019-11-09 DIAGNOSIS — F0391 Unspecified dementia with behavioral disturbance: Secondary | ICD-10-CM | POA: Diagnosis not present

## 2019-11-10 DIAGNOSIS — Z7189 Other specified counseling: Secondary | ICD-10-CM | POA: Diagnosis not present

## 2019-11-10 DIAGNOSIS — R1310 Dysphagia, unspecified: Secondary | ICD-10-CM | POA: Diagnosis not present

## 2019-11-10 DIAGNOSIS — F0391 Unspecified dementia with behavioral disturbance: Secondary | ICD-10-CM | POA: Diagnosis not present

## 2019-11-10 DIAGNOSIS — K5900 Constipation, unspecified: Secondary | ICD-10-CM | POA: Diagnosis not present

## 2019-11-11 DIAGNOSIS — F0391 Unspecified dementia with behavioral disturbance: Secondary | ICD-10-CM | POA: Diagnosis not present

## 2019-11-12 DIAGNOSIS — R7303 Prediabetes: Secondary | ICD-10-CM | POA: Diagnosis not present

## 2019-11-12 DIAGNOSIS — E639 Nutritional deficiency, unspecified: Secondary | ICD-10-CM | POA: Diagnosis not present

## 2019-11-12 DIAGNOSIS — Y92239 Unspecified place in hospital as the place of occurrence of the external cause: Secondary | ICD-10-CM | POA: Diagnosis not present

## 2019-11-12 DIAGNOSIS — R1311 Dysphagia, oral phase: Secondary | ICD-10-CM | POA: Diagnosis not present

## 2019-11-12 DIAGNOSIS — E876 Hypokalemia: Secondary | ICD-10-CM | POA: Diagnosis not present

## 2019-11-12 DIAGNOSIS — W19XXXA Unspecified fall, initial encounter: Secondary | ICD-10-CM | POA: Diagnosis not present

## 2019-11-12 DIAGNOSIS — F0391 Unspecified dementia with behavioral disturbance: Secondary | ICD-10-CM | POA: Diagnosis not present

## 2019-11-13 DIAGNOSIS — E876 Hypokalemia: Secondary | ICD-10-CM | POA: Diagnosis not present

## 2019-11-13 DIAGNOSIS — F0391 Unspecified dementia with behavioral disturbance: Secondary | ICD-10-CM | POA: Diagnosis not present

## 2019-11-13 DIAGNOSIS — E639 Nutritional deficiency, unspecified: Secondary | ICD-10-CM | POA: Diagnosis not present

## 2019-11-13 DIAGNOSIS — R7303 Prediabetes: Secondary | ICD-10-CM | POA: Diagnosis not present

## 2019-11-13 DIAGNOSIS — W19XXXA Unspecified fall, initial encounter: Secondary | ICD-10-CM | POA: Diagnosis not present

## 2019-11-13 DIAGNOSIS — Y92239 Unspecified place in hospital as the place of occurrence of the external cause: Secondary | ICD-10-CM | POA: Diagnosis not present

## 2019-11-13 DIAGNOSIS — R1311 Dysphagia, oral phase: Secondary | ICD-10-CM | POA: Diagnosis not present

## 2019-11-14 DIAGNOSIS — F0391 Unspecified dementia with behavioral disturbance: Secondary | ICD-10-CM | POA: Diagnosis not present

## 2019-11-15 DIAGNOSIS — F0391 Unspecified dementia with behavioral disturbance: Secondary | ICD-10-CM | POA: Diagnosis not present

## 2019-11-16 DIAGNOSIS — I959 Hypotension, unspecified: Secondary | ICD-10-CM | POA: Diagnosis not present

## 2019-11-16 DIAGNOSIS — K5900 Constipation, unspecified: Secondary | ICD-10-CM | POA: Diagnosis not present

## 2019-11-16 DIAGNOSIS — F0391 Unspecified dementia with behavioral disturbance: Secondary | ICD-10-CM | POA: Diagnosis not present

## 2019-11-16 DIAGNOSIS — E871 Hypo-osmolality and hyponatremia: Secondary | ICD-10-CM | POA: Diagnosis not present

## 2019-11-16 DIAGNOSIS — Z20822 Contact with and (suspected) exposure to covid-19: Secondary | ICD-10-CM | POA: Diagnosis not present

## 2019-11-16 DIAGNOSIS — E876 Hypokalemia: Secondary | ICD-10-CM | POA: Diagnosis not present

## 2019-11-16 DIAGNOSIS — R1311 Dysphagia, oral phase: Secondary | ICD-10-CM | POA: Diagnosis not present

## 2019-11-16 DIAGNOSIS — L219 Seborrheic dermatitis, unspecified: Secondary | ICD-10-CM | POA: Diagnosis not present

## 2019-11-16 DIAGNOSIS — R55 Syncope and collapse: Secondary | ICD-10-CM | POA: Diagnosis not present

## 2019-11-16 DIAGNOSIS — Z8679 Personal history of other diseases of the circulatory system: Secondary | ICD-10-CM | POA: Diagnosis not present

## 2019-11-16 DIAGNOSIS — R7303 Prediabetes: Secondary | ICD-10-CM | POA: Diagnosis not present

## 2019-11-17 DIAGNOSIS — R55 Syncope and collapse: Secondary | ICD-10-CM | POA: Diagnosis not present

## 2019-11-17 DIAGNOSIS — E871 Hypo-osmolality and hyponatremia: Secondary | ICD-10-CM | POA: Diagnosis not present

## 2019-11-17 DIAGNOSIS — Z20822 Contact with and (suspected) exposure to covid-19: Secondary | ICD-10-CM | POA: Diagnosis not present

## 2019-11-17 DIAGNOSIS — R1311 Dysphagia, oral phase: Secondary | ICD-10-CM | POA: Diagnosis not present

## 2019-11-17 DIAGNOSIS — L219 Seborrheic dermatitis, unspecified: Secondary | ICD-10-CM | POA: Diagnosis not present

## 2019-11-17 DIAGNOSIS — R7303 Prediabetes: Secondary | ICD-10-CM | POA: Diagnosis not present

## 2019-11-17 DIAGNOSIS — Z8679 Personal history of other diseases of the circulatory system: Secondary | ICD-10-CM | POA: Diagnosis not present

## 2019-11-17 DIAGNOSIS — I959 Hypotension, unspecified: Secondary | ICD-10-CM | POA: Diagnosis not present

## 2019-11-17 DIAGNOSIS — K5900 Constipation, unspecified: Secondary | ICD-10-CM | POA: Diagnosis not present

## 2019-11-17 DIAGNOSIS — F0391 Unspecified dementia with behavioral disturbance: Secondary | ICD-10-CM | POA: Diagnosis not present

## 2019-11-17 DIAGNOSIS — E876 Hypokalemia: Secondary | ICD-10-CM | POA: Diagnosis not present

## 2019-11-18 DIAGNOSIS — K5900 Constipation, unspecified: Secondary | ICD-10-CM | POA: Diagnosis not present

## 2019-11-18 DIAGNOSIS — R7303 Prediabetes: Secondary | ICD-10-CM | POA: Diagnosis not present

## 2019-11-18 DIAGNOSIS — R1310 Dysphagia, unspecified: Secondary | ICD-10-CM | POA: Diagnosis not present

## 2019-11-18 DIAGNOSIS — E876 Hypokalemia: Secondary | ICD-10-CM | POA: Diagnosis not present

## 2019-11-18 DIAGNOSIS — Z8679 Personal history of other diseases of the circulatory system: Secondary | ICD-10-CM | POA: Diagnosis not present

## 2019-11-18 DIAGNOSIS — R55 Syncope and collapse: Secondary | ICD-10-CM | POA: Diagnosis not present

## 2019-11-18 DIAGNOSIS — F0391 Unspecified dementia with behavioral disturbance: Secondary | ICD-10-CM | POA: Diagnosis not present

## 2019-11-18 DIAGNOSIS — L219 Seborrheic dermatitis, unspecified: Secondary | ICD-10-CM | POA: Diagnosis not present

## 2019-11-18 DIAGNOSIS — I959 Hypotension, unspecified: Secondary | ICD-10-CM | POA: Diagnosis not present

## 2019-11-18 DIAGNOSIS — E871 Hypo-osmolality and hyponatremia: Secondary | ICD-10-CM | POA: Diagnosis not present

## 2019-11-18 DIAGNOSIS — Z515 Encounter for palliative care: Secondary | ICD-10-CM | POA: Diagnosis not present

## 2019-11-18 DIAGNOSIS — R1311 Dysphagia, oral phase: Secondary | ICD-10-CM | POA: Diagnosis not present

## 2019-11-18 DIAGNOSIS — Z20822 Contact with and (suspected) exposure to covid-19: Secondary | ICD-10-CM | POA: Diagnosis not present

## 2019-11-19 DIAGNOSIS — K5900 Constipation, unspecified: Secondary | ICD-10-CM | POA: Diagnosis not present

## 2019-11-19 DIAGNOSIS — R55 Syncope and collapse: Secondary | ICD-10-CM | POA: Diagnosis not present

## 2019-11-19 DIAGNOSIS — R1311 Dysphagia, oral phase: Secondary | ICD-10-CM | POA: Diagnosis not present

## 2019-11-19 DIAGNOSIS — E871 Hypo-osmolality and hyponatremia: Secondary | ICD-10-CM | POA: Diagnosis not present

## 2019-11-19 DIAGNOSIS — Z20822 Contact with and (suspected) exposure to covid-19: Secondary | ICD-10-CM | POA: Diagnosis not present

## 2019-11-19 DIAGNOSIS — Z8679 Personal history of other diseases of the circulatory system: Secondary | ICD-10-CM | POA: Diagnosis not present

## 2019-11-19 DIAGNOSIS — F0391 Unspecified dementia with behavioral disturbance: Secondary | ICD-10-CM | POA: Diagnosis not present

## 2019-11-19 DIAGNOSIS — L219 Seborrheic dermatitis, unspecified: Secondary | ICD-10-CM | POA: Diagnosis not present

## 2019-11-19 DIAGNOSIS — E876 Hypokalemia: Secondary | ICD-10-CM | POA: Diagnosis not present

## 2019-11-19 DIAGNOSIS — I959 Hypotension, unspecified: Secondary | ICD-10-CM | POA: Diagnosis not present

## 2019-11-19 DIAGNOSIS — R7303 Prediabetes: Secondary | ICD-10-CM | POA: Diagnosis not present

## 2019-11-20 DIAGNOSIS — R55 Syncope and collapse: Secondary | ICD-10-CM | POA: Diagnosis not present

## 2019-11-20 DIAGNOSIS — L219 Seborrheic dermatitis, unspecified: Secondary | ICD-10-CM | POA: Diagnosis not present

## 2019-11-20 DIAGNOSIS — Z8679 Personal history of other diseases of the circulatory system: Secondary | ICD-10-CM | POA: Diagnosis not present

## 2019-11-20 DIAGNOSIS — E876 Hypokalemia: Secondary | ICD-10-CM | POA: Diagnosis not present

## 2019-11-20 DIAGNOSIS — Z20822 Contact with and (suspected) exposure to covid-19: Secondary | ICD-10-CM | POA: Diagnosis not present

## 2019-11-20 DIAGNOSIS — R1311 Dysphagia, oral phase: Secondary | ICD-10-CM | POA: Diagnosis not present

## 2019-11-20 DIAGNOSIS — R7303 Prediabetes: Secondary | ICD-10-CM | POA: Diagnosis not present

## 2019-11-20 DIAGNOSIS — K5900 Constipation, unspecified: Secondary | ICD-10-CM | POA: Diagnosis not present

## 2019-11-20 DIAGNOSIS — I959 Hypotension, unspecified: Secondary | ICD-10-CM | POA: Diagnosis not present

## 2019-11-20 DIAGNOSIS — F0391 Unspecified dementia with behavioral disturbance: Secondary | ICD-10-CM | POA: Diagnosis not present

## 2019-11-21 DIAGNOSIS — F0391 Unspecified dementia with behavioral disturbance: Secondary | ICD-10-CM | POA: Diagnosis not present

## 2019-11-22 DIAGNOSIS — E871 Hypo-osmolality and hyponatremia: Secondary | ICD-10-CM | POA: Diagnosis not present

## 2019-11-22 DIAGNOSIS — R55 Syncope and collapse: Secondary | ICD-10-CM | POA: Diagnosis not present

## 2019-11-22 DIAGNOSIS — I959 Hypotension, unspecified: Secondary | ICD-10-CM | POA: Diagnosis not present

## 2019-11-22 DIAGNOSIS — F0391 Unspecified dementia with behavioral disturbance: Secondary | ICD-10-CM | POA: Diagnosis not present

## 2019-11-22 DIAGNOSIS — R1311 Dysphagia, oral phase: Secondary | ICD-10-CM | POA: Diagnosis not present

## 2019-11-22 DIAGNOSIS — E876 Hypokalemia: Secondary | ICD-10-CM | POA: Diagnosis not present

## 2019-11-22 DIAGNOSIS — R7303 Prediabetes: Secondary | ICD-10-CM | POA: Diagnosis not present

## 2019-11-22 DIAGNOSIS — Z20822 Contact with and (suspected) exposure to covid-19: Secondary | ICD-10-CM | POA: Diagnosis not present

## 2019-11-22 DIAGNOSIS — L219 Seborrheic dermatitis, unspecified: Secondary | ICD-10-CM | POA: Diagnosis not present

## 2019-11-23 DIAGNOSIS — F0391 Unspecified dementia with behavioral disturbance: Secondary | ICD-10-CM | POA: Diagnosis not present

## 2019-11-24 DIAGNOSIS — K5900 Constipation, unspecified: Secondary | ICD-10-CM | POA: Diagnosis not present

## 2019-11-24 DIAGNOSIS — F0391 Unspecified dementia with behavioral disturbance: Secondary | ICD-10-CM | POA: Diagnosis not present

## 2019-11-24 DIAGNOSIS — Z20822 Contact with and (suspected) exposure to covid-19: Secondary | ICD-10-CM | POA: Diagnosis not present

## 2019-11-24 DIAGNOSIS — E639 Nutritional deficiency, unspecified: Secondary | ICD-10-CM | POA: Diagnosis not present

## 2019-11-24 DIAGNOSIS — R1311 Dysphagia, oral phase: Secondary | ICD-10-CM | POA: Diagnosis not present

## 2019-11-25 DIAGNOSIS — Z20822 Contact with and (suspected) exposure to covid-19: Secondary | ICD-10-CM | POA: Diagnosis not present

## 2019-11-25 DIAGNOSIS — R1311 Dysphagia, oral phase: Secondary | ICD-10-CM | POA: Diagnosis not present

## 2019-11-25 DIAGNOSIS — E639 Nutritional deficiency, unspecified: Secondary | ICD-10-CM | POA: Diagnosis not present

## 2019-11-25 DIAGNOSIS — F0391 Unspecified dementia with behavioral disturbance: Secondary | ICD-10-CM | POA: Diagnosis not present

## 2019-11-25 DIAGNOSIS — K5900 Constipation, unspecified: Secondary | ICD-10-CM | POA: Diagnosis not present

## 2019-11-26 DIAGNOSIS — E639 Nutritional deficiency, unspecified: Secondary | ICD-10-CM | POA: Diagnosis not present

## 2019-11-26 DIAGNOSIS — I959 Hypotension, unspecified: Secondary | ICD-10-CM | POA: Diagnosis not present

## 2019-11-26 DIAGNOSIS — K5900 Constipation, unspecified: Secondary | ICD-10-CM | POA: Diagnosis not present

## 2019-11-26 DIAGNOSIS — F0391 Unspecified dementia with behavioral disturbance: Secondary | ICD-10-CM | POA: Diagnosis not present

## 2019-11-27 DIAGNOSIS — F0391 Unspecified dementia with behavioral disturbance: Secondary | ICD-10-CM | POA: Diagnosis not present

## 2019-11-28 DIAGNOSIS — E639 Nutritional deficiency, unspecified: Secondary | ICD-10-CM | POA: Diagnosis not present

## 2019-11-28 DIAGNOSIS — K5900 Constipation, unspecified: Secondary | ICD-10-CM | POA: Diagnosis not present

## 2019-11-28 DIAGNOSIS — F0391 Unspecified dementia with behavioral disturbance: Secondary | ICD-10-CM | POA: Diagnosis not present

## 2019-11-28 DIAGNOSIS — I959 Hypotension, unspecified: Secondary | ICD-10-CM | POA: Diagnosis not present

## 2019-11-29 DIAGNOSIS — F0391 Unspecified dementia with behavioral disturbance: Secondary | ICD-10-CM | POA: Diagnosis not present

## 2019-11-30 DIAGNOSIS — R7303 Prediabetes: Secondary | ICD-10-CM | POA: Diagnosis not present

## 2019-11-30 DIAGNOSIS — I959 Hypotension, unspecified: Secondary | ICD-10-CM | POA: Diagnosis not present

## 2019-11-30 DIAGNOSIS — R1311 Dysphagia, oral phase: Secondary | ICD-10-CM | POA: Diagnosis not present

## 2019-11-30 DIAGNOSIS — K5900 Constipation, unspecified: Secondary | ICD-10-CM | POA: Diagnosis not present

## 2019-11-30 DIAGNOSIS — E876 Hypokalemia: Secondary | ICD-10-CM | POA: Diagnosis not present

## 2019-11-30 DIAGNOSIS — Z20822 Contact with and (suspected) exposure to covid-19: Secondary | ICD-10-CM | POA: Diagnosis not present

## 2019-11-30 DIAGNOSIS — L219 Seborrheic dermatitis, unspecified: Secondary | ICD-10-CM | POA: Diagnosis not present

## 2019-11-30 DIAGNOSIS — R55 Syncope and collapse: Secondary | ICD-10-CM | POA: Diagnosis not present

## 2019-11-30 DIAGNOSIS — Z8679 Personal history of other diseases of the circulatory system: Secondary | ICD-10-CM | POA: Diagnosis not present

## 2019-11-30 DIAGNOSIS — F0391 Unspecified dementia with behavioral disturbance: Secondary | ICD-10-CM | POA: Diagnosis not present

## 2019-12-01 DIAGNOSIS — F0391 Unspecified dementia with behavioral disturbance: Secondary | ICD-10-CM | POA: Diagnosis not present

## 2019-12-02 DIAGNOSIS — F0391 Unspecified dementia with behavioral disturbance: Secondary | ICD-10-CM | POA: Diagnosis not present

## 2019-12-02 DIAGNOSIS — I959 Hypotension, unspecified: Secondary | ICD-10-CM | POA: Diagnosis not present

## 2019-12-02 DIAGNOSIS — R7303 Prediabetes: Secondary | ICD-10-CM | POA: Diagnosis not present

## 2019-12-02 DIAGNOSIS — R1311 Dysphagia, oral phase: Secondary | ICD-10-CM | POA: Diagnosis not present

## 2019-12-02 DIAGNOSIS — L219 Seborrheic dermatitis, unspecified: Secondary | ICD-10-CM | POA: Diagnosis not present

## 2019-12-02 DIAGNOSIS — K5900 Constipation, unspecified: Secondary | ICD-10-CM | POA: Diagnosis not present

## 2019-12-02 DIAGNOSIS — Z20822 Contact with and (suspected) exposure to covid-19: Secondary | ICD-10-CM | POA: Diagnosis not present

## 2019-12-02 DIAGNOSIS — Z8679 Personal history of other diseases of the circulatory system: Secondary | ICD-10-CM | POA: Diagnosis not present

## 2019-12-02 DIAGNOSIS — R55 Syncope and collapse: Secondary | ICD-10-CM | POA: Diagnosis not present

## 2019-12-02 DIAGNOSIS — E876 Hypokalemia: Secondary | ICD-10-CM | POA: Diagnosis not present

## 2019-12-03 DIAGNOSIS — Z20822 Contact with and (suspected) exposure to covid-19: Secondary | ICD-10-CM | POA: Diagnosis not present

## 2019-12-03 DIAGNOSIS — F0391 Unspecified dementia with behavioral disturbance: Secondary | ICD-10-CM | POA: Diagnosis not present

## 2019-12-03 DIAGNOSIS — L219 Seborrheic dermatitis, unspecified: Secondary | ICD-10-CM | POA: Diagnosis not present

## 2019-12-03 DIAGNOSIS — R1311 Dysphagia, oral phase: Secondary | ICD-10-CM | POA: Diagnosis not present

## 2019-12-03 DIAGNOSIS — K5900 Constipation, unspecified: Secondary | ICD-10-CM | POA: Diagnosis not present

## 2019-12-03 DIAGNOSIS — Z515 Encounter for palliative care: Secondary | ICD-10-CM | POA: Diagnosis not present

## 2019-12-04 DIAGNOSIS — F0391 Unspecified dementia with behavioral disturbance: Secondary | ICD-10-CM | POA: Diagnosis not present

## 2019-12-05 DIAGNOSIS — Z20822 Contact with and (suspected) exposure to covid-19: Secondary | ICD-10-CM | POA: Diagnosis not present

## 2019-12-05 DIAGNOSIS — I959 Hypotension, unspecified: Secondary | ICD-10-CM | POA: Diagnosis not present

## 2019-12-05 DIAGNOSIS — R55 Syncope and collapse: Secondary | ICD-10-CM | POA: Diagnosis not present

## 2019-12-05 DIAGNOSIS — R7303 Prediabetes: Secondary | ICD-10-CM | POA: Diagnosis not present

## 2019-12-05 DIAGNOSIS — L219 Seborrheic dermatitis, unspecified: Secondary | ICD-10-CM | POA: Diagnosis not present

## 2019-12-05 DIAGNOSIS — E876 Hypokalemia: Secondary | ICD-10-CM | POA: Diagnosis not present

## 2019-12-05 DIAGNOSIS — R1311 Dysphagia, oral phase: Secondary | ICD-10-CM | POA: Diagnosis not present

## 2019-12-05 DIAGNOSIS — F0391 Unspecified dementia with behavioral disturbance: Secondary | ICD-10-CM | POA: Diagnosis not present

## 2019-12-05 DIAGNOSIS — E871 Hypo-osmolality and hyponatremia: Secondary | ICD-10-CM | POA: Diagnosis not present

## 2019-12-06 DIAGNOSIS — F0391 Unspecified dementia with behavioral disturbance: Secondary | ICD-10-CM | POA: Diagnosis not present

## 2019-12-07 DIAGNOSIS — F0391 Unspecified dementia with behavioral disturbance: Secondary | ICD-10-CM | POA: Diagnosis not present

## 2019-12-08 DIAGNOSIS — R1310 Dysphagia, unspecified: Secondary | ICD-10-CM | POA: Diagnosis not present

## 2019-12-08 DIAGNOSIS — Y92239 Unspecified place in hospital as the place of occurrence of the external cause: Secondary | ICD-10-CM | POA: Diagnosis not present

## 2019-12-08 DIAGNOSIS — K5900 Constipation, unspecified: Secondary | ICD-10-CM | POA: Diagnosis not present

## 2019-12-08 DIAGNOSIS — F0391 Unspecified dementia with behavioral disturbance: Secondary | ICD-10-CM | POA: Diagnosis not present

## 2019-12-08 DIAGNOSIS — W19XXXS Unspecified fall, sequela: Secondary | ICD-10-CM | POA: Diagnosis not present

## 2019-12-08 DIAGNOSIS — E639 Nutritional deficiency, unspecified: Secondary | ICD-10-CM | POA: Diagnosis not present

## 2019-12-08 DIAGNOSIS — Z20822 Contact with and (suspected) exposure to covid-19: Secondary | ICD-10-CM | POA: Diagnosis not present

## 2019-12-09 DIAGNOSIS — F0391 Unspecified dementia with behavioral disturbance: Secondary | ICD-10-CM | POA: Diagnosis not present

## 2019-12-10 DIAGNOSIS — F0391 Unspecified dementia with behavioral disturbance: Secondary | ICD-10-CM | POA: Diagnosis not present

## 2019-12-11 DIAGNOSIS — R1310 Dysphagia, unspecified: Secondary | ICD-10-CM | POA: Diagnosis not present

## 2019-12-11 DIAGNOSIS — K5900 Constipation, unspecified: Secondary | ICD-10-CM | POA: Diagnosis not present

## 2019-12-11 DIAGNOSIS — F0391 Unspecified dementia with behavioral disturbance: Secondary | ICD-10-CM | POA: Diagnosis not present

## 2019-12-11 DIAGNOSIS — Z515 Encounter for palliative care: Secondary | ICD-10-CM | POA: Diagnosis not present

## 2019-12-12 DIAGNOSIS — F0391 Unspecified dementia with behavioral disturbance: Secondary | ICD-10-CM | POA: Diagnosis not present

## 2019-12-12 DIAGNOSIS — E639 Nutritional deficiency, unspecified: Secondary | ICD-10-CM | POA: Diagnosis not present

## 2019-12-12 DIAGNOSIS — K5900 Constipation, unspecified: Secondary | ICD-10-CM | POA: Diagnosis not present

## 2019-12-12 DIAGNOSIS — Z9181 History of falling: Secondary | ICD-10-CM | POA: Diagnosis not present

## 2019-12-13 DIAGNOSIS — F0391 Unspecified dementia with behavioral disturbance: Secondary | ICD-10-CM | POA: Diagnosis not present

## 2019-12-14 DIAGNOSIS — L219 Seborrheic dermatitis, unspecified: Secondary | ICD-10-CM | POA: Diagnosis not present

## 2019-12-14 DIAGNOSIS — I959 Hypotension, unspecified: Secondary | ICD-10-CM | POA: Diagnosis not present

## 2019-12-14 DIAGNOSIS — R1311 Dysphagia, oral phase: Secondary | ICD-10-CM | POA: Diagnosis not present

## 2019-12-14 DIAGNOSIS — R7303 Prediabetes: Secondary | ICD-10-CM | POA: Diagnosis not present

## 2019-12-14 DIAGNOSIS — R55 Syncope and collapse: Secondary | ICD-10-CM | POA: Diagnosis not present

## 2019-12-14 DIAGNOSIS — F0391 Unspecified dementia with behavioral disturbance: Secondary | ICD-10-CM | POA: Diagnosis not present

## 2019-12-14 DIAGNOSIS — Z20822 Contact with and (suspected) exposure to covid-19: Secondary | ICD-10-CM | POA: Diagnosis not present

## 2019-12-14 DIAGNOSIS — K5900 Constipation, unspecified: Secondary | ICD-10-CM | POA: Diagnosis not present

## 2019-12-14 DIAGNOSIS — Z8679 Personal history of other diseases of the circulatory system: Secondary | ICD-10-CM | POA: Diagnosis not present

## 2019-12-14 DIAGNOSIS — E876 Hypokalemia: Secondary | ICD-10-CM | POA: Diagnosis not present

## 2019-12-15 DIAGNOSIS — F0391 Unspecified dementia with behavioral disturbance: Secondary | ICD-10-CM | POA: Diagnosis not present

## 2019-12-16 DIAGNOSIS — I959 Hypotension, unspecified: Secondary | ICD-10-CM | POA: Diagnosis not present

## 2019-12-16 DIAGNOSIS — E876 Hypokalemia: Secondary | ICD-10-CM | POA: Diagnosis not present

## 2019-12-16 DIAGNOSIS — F0391 Unspecified dementia with behavioral disturbance: Secondary | ICD-10-CM | POA: Diagnosis not present

## 2019-12-16 DIAGNOSIS — K5900 Constipation, unspecified: Secondary | ICD-10-CM | POA: Diagnosis not present

## 2019-12-16 DIAGNOSIS — R7303 Prediabetes: Secondary | ICD-10-CM | POA: Diagnosis not present

## 2019-12-16 DIAGNOSIS — Z20822 Contact with and (suspected) exposure to covid-19: Secondary | ICD-10-CM | POA: Diagnosis not present

## 2019-12-16 DIAGNOSIS — L219 Seborrheic dermatitis, unspecified: Secondary | ICD-10-CM | POA: Diagnosis not present

## 2019-12-16 DIAGNOSIS — R55 Syncope and collapse: Secondary | ICD-10-CM | POA: Diagnosis not present

## 2019-12-16 DIAGNOSIS — R1311 Dysphagia, oral phase: Secondary | ICD-10-CM | POA: Diagnosis not present

## 2019-12-16 DIAGNOSIS — Z8679 Personal history of other diseases of the circulatory system: Secondary | ICD-10-CM | POA: Diagnosis not present

## 2019-12-17 DIAGNOSIS — Z20822 Contact with and (suspected) exposure to covid-19: Secondary | ICD-10-CM | POA: Diagnosis not present

## 2019-12-17 DIAGNOSIS — R7303 Prediabetes: Secondary | ICD-10-CM | POA: Diagnosis not present

## 2019-12-17 DIAGNOSIS — Z8679 Personal history of other diseases of the circulatory system: Secondary | ICD-10-CM | POA: Diagnosis not present

## 2019-12-17 DIAGNOSIS — Z515 Encounter for palliative care: Secondary | ICD-10-CM | POA: Diagnosis not present

## 2019-12-17 DIAGNOSIS — R1311 Dysphagia, oral phase: Secondary | ICD-10-CM | POA: Diagnosis not present

## 2019-12-17 DIAGNOSIS — K5900 Constipation, unspecified: Secondary | ICD-10-CM | POA: Diagnosis not present

## 2019-12-17 DIAGNOSIS — R55 Syncope and collapse: Secondary | ICD-10-CM | POA: Diagnosis not present

## 2019-12-17 DIAGNOSIS — F0391 Unspecified dementia with behavioral disturbance: Secondary | ICD-10-CM | POA: Diagnosis not present

## 2019-12-17 DIAGNOSIS — E876 Hypokalemia: Secondary | ICD-10-CM | POA: Diagnosis not present

## 2019-12-17 DIAGNOSIS — Z7189 Other specified counseling: Secondary | ICD-10-CM | POA: Diagnosis not present

## 2019-12-17 DIAGNOSIS — I959 Hypotension, unspecified: Secondary | ICD-10-CM | POA: Diagnosis not present

## 2019-12-17 DIAGNOSIS — L219 Seborrheic dermatitis, unspecified: Secondary | ICD-10-CM | POA: Diagnosis not present

## 2019-12-18 DIAGNOSIS — Z8679 Personal history of other diseases of the circulatory system: Secondary | ICD-10-CM | POA: Diagnosis not present

## 2019-12-18 DIAGNOSIS — I959 Hypotension, unspecified: Secondary | ICD-10-CM | POA: Diagnosis not present

## 2019-12-18 DIAGNOSIS — R55 Syncope and collapse: Secondary | ICD-10-CM | POA: Diagnosis not present

## 2019-12-18 DIAGNOSIS — R7303 Prediabetes: Secondary | ICD-10-CM | POA: Diagnosis not present

## 2019-12-18 DIAGNOSIS — R1311 Dysphagia, oral phase: Secondary | ICD-10-CM | POA: Diagnosis not present

## 2019-12-18 DIAGNOSIS — E876 Hypokalemia: Secondary | ICD-10-CM | POA: Diagnosis not present

## 2019-12-18 DIAGNOSIS — Z20822 Contact with and (suspected) exposure to covid-19: Secondary | ICD-10-CM | POA: Diagnosis not present

## 2019-12-18 DIAGNOSIS — L219 Seborrheic dermatitis, unspecified: Secondary | ICD-10-CM | POA: Diagnosis not present

## 2019-12-18 DIAGNOSIS — F0391 Unspecified dementia with behavioral disturbance: Secondary | ICD-10-CM | POA: Diagnosis not present

## 2019-12-18 DIAGNOSIS — K5900 Constipation, unspecified: Secondary | ICD-10-CM | POA: Diagnosis not present

## 2019-12-19 DIAGNOSIS — F0391 Unspecified dementia with behavioral disturbance: Secondary | ICD-10-CM | POA: Diagnosis not present

## 2019-12-20 DIAGNOSIS — L219 Seborrheic dermatitis, unspecified: Secondary | ICD-10-CM | POA: Diagnosis not present

## 2019-12-20 DIAGNOSIS — E639 Nutritional deficiency, unspecified: Secondary | ICD-10-CM | POA: Diagnosis not present

## 2019-12-20 DIAGNOSIS — Y92239 Unspecified place in hospital as the place of occurrence of the external cause: Secondary | ICD-10-CM | POA: Diagnosis not present

## 2019-12-20 DIAGNOSIS — K5900 Constipation, unspecified: Secondary | ICD-10-CM | POA: Diagnosis not present

## 2019-12-20 DIAGNOSIS — R55 Syncope and collapse: Secondary | ICD-10-CM | POA: Diagnosis not present

## 2019-12-20 DIAGNOSIS — W19XXXS Unspecified fall, sequela: Secondary | ICD-10-CM | POA: Diagnosis not present

## 2019-12-20 DIAGNOSIS — Z20822 Contact with and (suspected) exposure to covid-19: Secondary | ICD-10-CM | POA: Diagnosis not present

## 2019-12-20 DIAGNOSIS — F0391 Unspecified dementia with behavioral disturbance: Secondary | ICD-10-CM | POA: Diagnosis not present

## 2019-12-20 DIAGNOSIS — E871 Hypo-osmolality and hyponatremia: Secondary | ICD-10-CM | POA: Diagnosis not present

## 2019-12-20 DIAGNOSIS — R1311 Dysphagia, oral phase: Secondary | ICD-10-CM | POA: Diagnosis not present

## 2019-12-20 DIAGNOSIS — E876 Hypokalemia: Secondary | ICD-10-CM | POA: Diagnosis not present

## 2019-12-20 DIAGNOSIS — R7303 Prediabetes: Secondary | ICD-10-CM | POA: Diagnosis not present

## 2019-12-20 DIAGNOSIS — I959 Hypotension, unspecified: Secondary | ICD-10-CM | POA: Diagnosis not present

## 2019-12-21 DIAGNOSIS — F0391 Unspecified dementia with behavioral disturbance: Secondary | ICD-10-CM | POA: Diagnosis not present

## 2019-12-22 DIAGNOSIS — Z9181 History of falling: Secondary | ICD-10-CM | POA: Diagnosis not present

## 2019-12-22 DIAGNOSIS — R1311 Dysphagia, oral phase: Secondary | ICD-10-CM | POA: Diagnosis not present

## 2019-12-22 DIAGNOSIS — F0391 Unspecified dementia with behavioral disturbance: Secondary | ICD-10-CM | POA: Diagnosis not present

## 2019-12-22 DIAGNOSIS — E639 Nutritional deficiency, unspecified: Secondary | ICD-10-CM | POA: Diagnosis not present

## 2019-12-22 DIAGNOSIS — K5900 Constipation, unspecified: Secondary | ICD-10-CM | POA: Diagnosis not present

## 2019-12-23 DIAGNOSIS — L219 Seborrheic dermatitis, unspecified: Secondary | ICD-10-CM | POA: Diagnosis not present

## 2019-12-23 DIAGNOSIS — R7303 Prediabetes: Secondary | ICD-10-CM | POA: Diagnosis not present

## 2019-12-23 DIAGNOSIS — Y92239 Unspecified place in hospital as the place of occurrence of the external cause: Secondary | ICD-10-CM | POA: Diagnosis not present

## 2019-12-23 DIAGNOSIS — F0391 Unspecified dementia with behavioral disturbance: Secondary | ICD-10-CM | POA: Diagnosis not present

## 2019-12-23 DIAGNOSIS — R1311 Dysphagia, oral phase: Secondary | ICD-10-CM | POA: Diagnosis not present

## 2019-12-23 DIAGNOSIS — E639 Nutritional deficiency, unspecified: Secondary | ICD-10-CM | POA: Diagnosis not present

## 2019-12-23 DIAGNOSIS — I959 Hypotension, unspecified: Secondary | ICD-10-CM | POA: Diagnosis not present

## 2019-12-23 DIAGNOSIS — R55 Syncope and collapse: Secondary | ICD-10-CM | POA: Diagnosis not present

## 2019-12-23 DIAGNOSIS — W19XXXS Unspecified fall, sequela: Secondary | ICD-10-CM | POA: Diagnosis not present

## 2019-12-23 DIAGNOSIS — K5900 Constipation, unspecified: Secondary | ICD-10-CM | POA: Diagnosis not present

## 2019-12-23 DIAGNOSIS — Z20822 Contact with and (suspected) exposure to covid-19: Secondary | ICD-10-CM | POA: Diagnosis not present

## 2019-12-24 DIAGNOSIS — F0391 Unspecified dementia with behavioral disturbance: Secondary | ICD-10-CM | POA: Diagnosis not present

## 2019-12-25 DIAGNOSIS — K5900 Constipation, unspecified: Secondary | ICD-10-CM | POA: Diagnosis not present

## 2019-12-25 DIAGNOSIS — F0391 Unspecified dementia with behavioral disturbance: Secondary | ICD-10-CM | POA: Diagnosis not present

## 2019-12-25 DIAGNOSIS — N3001 Acute cystitis with hematuria: Secondary | ICD-10-CM | POA: Diagnosis not present

## 2019-12-25 DIAGNOSIS — R1311 Dysphagia, oral phase: Secondary | ICD-10-CM | POA: Diagnosis not present

## 2019-12-26 DIAGNOSIS — F0391 Unspecified dementia with behavioral disturbance: Secondary | ICD-10-CM | POA: Diagnosis not present

## 2019-12-26 DIAGNOSIS — N3001 Acute cystitis with hematuria: Secondary | ICD-10-CM | POA: Diagnosis not present

## 2019-12-26 DIAGNOSIS — K5900 Constipation, unspecified: Secondary | ICD-10-CM | POA: Diagnosis not present

## 2019-12-26 DIAGNOSIS — R1311 Dysphagia, oral phase: Secondary | ICD-10-CM | POA: Diagnosis not present

## 2019-12-27 DIAGNOSIS — K5900 Constipation, unspecified: Secondary | ICD-10-CM | POA: Diagnosis not present

## 2019-12-27 DIAGNOSIS — N390 Urinary tract infection, site not specified: Secondary | ICD-10-CM | POA: Diagnosis not present

## 2019-12-27 DIAGNOSIS — B9620 Unspecified Escherichia coli [E. coli] as the cause of diseases classified elsewhere: Secondary | ICD-10-CM | POA: Diagnosis not present

## 2019-12-27 DIAGNOSIS — F0391 Unspecified dementia with behavioral disturbance: Secondary | ICD-10-CM | POA: Diagnosis not present

## 2019-12-28 DIAGNOSIS — F0391 Unspecified dementia with behavioral disturbance: Secondary | ICD-10-CM | POA: Diagnosis not present

## 2019-12-29 DIAGNOSIS — E876 Hypokalemia: Secondary | ICD-10-CM | POA: Diagnosis not present

## 2019-12-29 DIAGNOSIS — Z20822 Contact with and (suspected) exposure to covid-19: Secondary | ICD-10-CM | POA: Diagnosis not present

## 2019-12-29 DIAGNOSIS — L219 Seborrheic dermatitis, unspecified: Secondary | ICD-10-CM | POA: Diagnosis not present

## 2019-12-29 DIAGNOSIS — F0391 Unspecified dementia with behavioral disturbance: Secondary | ICD-10-CM | POA: Diagnosis not present

## 2019-12-29 DIAGNOSIS — Z515 Encounter for palliative care: Secondary | ICD-10-CM | POA: Diagnosis not present

## 2019-12-29 DIAGNOSIS — R1311 Dysphagia, oral phase: Secondary | ICD-10-CM | POA: Diagnosis not present

## 2019-12-29 DIAGNOSIS — R55 Syncope and collapse: Secondary | ICD-10-CM | POA: Diagnosis not present

## 2019-12-29 DIAGNOSIS — R1310 Dysphagia, unspecified: Secondary | ICD-10-CM | POA: Diagnosis not present

## 2019-12-29 DIAGNOSIS — R7303 Prediabetes: Secondary | ICD-10-CM | POA: Diagnosis not present

## 2019-12-29 DIAGNOSIS — Z8679 Personal history of other diseases of the circulatory system: Secondary | ICD-10-CM | POA: Diagnosis not present

## 2019-12-29 DIAGNOSIS — K5900 Constipation, unspecified: Secondary | ICD-10-CM | POA: Diagnosis not present

## 2019-12-29 DIAGNOSIS — I959 Hypotension, unspecified: Secondary | ICD-10-CM | POA: Diagnosis not present

## 2019-12-30 DIAGNOSIS — F0391 Unspecified dementia with behavioral disturbance: Secondary | ICD-10-CM | POA: Diagnosis not present

## 2019-12-31 DIAGNOSIS — Y92239 Unspecified place in hospital as the place of occurrence of the external cause: Secondary | ICD-10-CM | POA: Diagnosis not present

## 2019-12-31 DIAGNOSIS — E639 Nutritional deficiency, unspecified: Secondary | ICD-10-CM | POA: Diagnosis not present

## 2019-12-31 DIAGNOSIS — L219 Seborrheic dermatitis, unspecified: Secondary | ICD-10-CM | POA: Diagnosis not present

## 2019-12-31 DIAGNOSIS — R55 Syncope and collapse: Secondary | ICD-10-CM | POA: Diagnosis not present

## 2019-12-31 DIAGNOSIS — W19XXXS Unspecified fall, sequela: Secondary | ICD-10-CM | POA: Diagnosis not present

## 2019-12-31 DIAGNOSIS — N3001 Acute cystitis with hematuria: Secondary | ICD-10-CM | POA: Diagnosis not present

## 2019-12-31 DIAGNOSIS — I959 Hypotension, unspecified: Secondary | ICD-10-CM | POA: Diagnosis not present

## 2019-12-31 DIAGNOSIS — R7303 Prediabetes: Secondary | ICD-10-CM | POA: Diagnosis not present

## 2019-12-31 DIAGNOSIS — K5900 Constipation, unspecified: Secondary | ICD-10-CM | POA: Diagnosis not present

## 2019-12-31 DIAGNOSIS — R1311 Dysphagia, oral phase: Secondary | ICD-10-CM | POA: Diagnosis not present

## 2019-12-31 DIAGNOSIS — Z20822 Contact with and (suspected) exposure to covid-19: Secondary | ICD-10-CM | POA: Diagnosis not present

## 2019-12-31 DIAGNOSIS — F0391 Unspecified dementia with behavioral disturbance: Secondary | ICD-10-CM | POA: Diagnosis not present

## 2020-01-01 DIAGNOSIS — F0391 Unspecified dementia with behavioral disturbance: Secondary | ICD-10-CM | POA: Diagnosis not present

## 2020-01-02 DIAGNOSIS — R7303 Prediabetes: Secondary | ICD-10-CM | POA: Diagnosis not present

## 2020-01-02 DIAGNOSIS — F0391 Unspecified dementia with behavioral disturbance: Secondary | ICD-10-CM | POA: Diagnosis not present

## 2020-01-02 DIAGNOSIS — N3001 Acute cystitis with hematuria: Secondary | ICD-10-CM | POA: Diagnosis not present

## 2020-01-02 DIAGNOSIS — R55 Syncope and collapse: Secondary | ICD-10-CM | POA: Diagnosis not present

## 2020-01-02 DIAGNOSIS — I959 Hypotension, unspecified: Secondary | ICD-10-CM | POA: Diagnosis not present

## 2020-01-02 DIAGNOSIS — R1311 Dysphagia, oral phase: Secondary | ICD-10-CM | POA: Diagnosis not present

## 2020-01-02 DIAGNOSIS — Z20822 Contact with and (suspected) exposure to covid-19: Secondary | ICD-10-CM | POA: Diagnosis not present

## 2020-01-02 DIAGNOSIS — K5900 Constipation, unspecified: Secondary | ICD-10-CM | POA: Diagnosis not present

## 2020-01-02 DIAGNOSIS — E639 Nutritional deficiency, unspecified: Secondary | ICD-10-CM | POA: Diagnosis not present

## 2020-01-02 DIAGNOSIS — Y92239 Unspecified place in hospital as the place of occurrence of the external cause: Secondary | ICD-10-CM | POA: Diagnosis not present

## 2020-01-02 DIAGNOSIS — W19XXXS Unspecified fall, sequela: Secondary | ICD-10-CM | POA: Diagnosis not present

## 2020-01-02 DIAGNOSIS — L219 Seborrheic dermatitis, unspecified: Secondary | ICD-10-CM | POA: Diagnosis not present

## 2020-01-03 DIAGNOSIS — F0391 Unspecified dementia with behavioral disturbance: Secondary | ICD-10-CM | POA: Diagnosis not present

## 2020-01-04 DIAGNOSIS — I959 Hypotension, unspecified: Secondary | ICD-10-CM | POA: Diagnosis not present

## 2020-01-04 DIAGNOSIS — N3001 Acute cystitis with hematuria: Secondary | ICD-10-CM | POA: Diagnosis not present

## 2020-01-04 DIAGNOSIS — Y92239 Unspecified place in hospital as the place of occurrence of the external cause: Secondary | ICD-10-CM | POA: Diagnosis not present

## 2020-01-04 DIAGNOSIS — Z20822 Contact with and (suspected) exposure to covid-19: Secondary | ICD-10-CM | POA: Diagnosis not present

## 2020-01-04 DIAGNOSIS — R7303 Prediabetes: Secondary | ICD-10-CM | POA: Diagnosis not present

## 2020-01-04 DIAGNOSIS — R1311 Dysphagia, oral phase: Secondary | ICD-10-CM | POA: Diagnosis not present

## 2020-01-04 DIAGNOSIS — F0391 Unspecified dementia with behavioral disturbance: Secondary | ICD-10-CM | POA: Diagnosis not present

## 2020-01-04 DIAGNOSIS — K5900 Constipation, unspecified: Secondary | ICD-10-CM | POA: Diagnosis not present

## 2020-01-04 DIAGNOSIS — L219 Seborrheic dermatitis, unspecified: Secondary | ICD-10-CM | POA: Diagnosis not present

## 2020-01-04 DIAGNOSIS — E639 Nutritional deficiency, unspecified: Secondary | ICD-10-CM | POA: Diagnosis not present

## 2020-01-04 DIAGNOSIS — R55 Syncope and collapse: Secondary | ICD-10-CM | POA: Diagnosis not present

## 2020-01-04 DIAGNOSIS — W19XXXS Unspecified fall, sequela: Secondary | ICD-10-CM | POA: Diagnosis not present

## 2020-01-05 DIAGNOSIS — F0391 Unspecified dementia with behavioral disturbance: Secondary | ICD-10-CM | POA: Diagnosis not present

## 2020-01-06 DIAGNOSIS — R1310 Dysphagia, unspecified: Secondary | ICD-10-CM | POA: Diagnosis not present

## 2020-01-06 DIAGNOSIS — K5900 Constipation, unspecified: Secondary | ICD-10-CM | POA: Diagnosis not present

## 2020-01-06 DIAGNOSIS — E639 Nutritional deficiency, unspecified: Secondary | ICD-10-CM | POA: Diagnosis not present

## 2020-01-06 DIAGNOSIS — F0391 Unspecified dementia with behavioral disturbance: Secondary | ICD-10-CM | POA: Diagnosis not present

## 2020-01-07 DIAGNOSIS — F0391 Unspecified dementia with behavioral disturbance: Secondary | ICD-10-CM | POA: Diagnosis not present

## 2020-01-08 DIAGNOSIS — E639 Nutritional deficiency, unspecified: Secondary | ICD-10-CM | POA: Diagnosis not present

## 2020-01-08 DIAGNOSIS — F0391 Unspecified dementia with behavioral disturbance: Secondary | ICD-10-CM | POA: Diagnosis not present

## 2020-01-08 DIAGNOSIS — K5900 Constipation, unspecified: Secondary | ICD-10-CM | POA: Diagnosis not present

## 2020-01-09 DIAGNOSIS — F0391 Unspecified dementia with behavioral disturbance: Secondary | ICD-10-CM | POA: Diagnosis not present

## 2020-01-10 DIAGNOSIS — K5900 Constipation, unspecified: Secondary | ICD-10-CM | POA: Diagnosis not present

## 2020-01-10 DIAGNOSIS — F0391 Unspecified dementia with behavioral disturbance: Secondary | ICD-10-CM | POA: Diagnosis not present

## 2020-01-10 DIAGNOSIS — E639 Nutritional deficiency, unspecified: Secondary | ICD-10-CM | POA: Diagnosis not present

## 2020-01-11 DIAGNOSIS — F0391 Unspecified dementia with behavioral disturbance: Secondary | ICD-10-CM | POA: Diagnosis not present

## 2020-01-11 DIAGNOSIS — K5900 Constipation, unspecified: Secondary | ICD-10-CM | POA: Diagnosis not present

## 2020-01-11 DIAGNOSIS — L219 Seborrheic dermatitis, unspecified: Secondary | ICD-10-CM | POA: Diagnosis not present

## 2020-01-11 DIAGNOSIS — I959 Hypotension, unspecified: Secondary | ICD-10-CM | POA: Diagnosis not present

## 2020-01-11 DIAGNOSIS — R55 Syncope and collapse: Secondary | ICD-10-CM | POA: Diagnosis not present

## 2020-01-11 DIAGNOSIS — Z20822 Contact with and (suspected) exposure to covid-19: Secondary | ICD-10-CM | POA: Diagnosis not present

## 2020-01-11 DIAGNOSIS — Z8679 Personal history of other diseases of the circulatory system: Secondary | ICD-10-CM | POA: Diagnosis not present

## 2020-01-11 DIAGNOSIS — E876 Hypokalemia: Secondary | ICD-10-CM | POA: Diagnosis not present

## 2020-01-11 DIAGNOSIS — R1311 Dysphagia, oral phase: Secondary | ICD-10-CM | POA: Diagnosis not present

## 2020-01-11 DIAGNOSIS — R7303 Prediabetes: Secondary | ICD-10-CM | POA: Diagnosis not present

## 2020-01-12 DIAGNOSIS — R55 Syncope and collapse: Secondary | ICD-10-CM | POA: Diagnosis not present

## 2020-01-12 DIAGNOSIS — F0391 Unspecified dementia with behavioral disturbance: Secondary | ICD-10-CM | POA: Diagnosis not present

## 2020-01-12 DIAGNOSIS — L219 Seborrheic dermatitis, unspecified: Secondary | ICD-10-CM | POA: Diagnosis not present

## 2020-01-12 DIAGNOSIS — R7303 Prediabetes: Secondary | ICD-10-CM | POA: Diagnosis not present

## 2020-01-12 DIAGNOSIS — K5900 Constipation, unspecified: Secondary | ICD-10-CM | POA: Diagnosis not present

## 2020-01-12 DIAGNOSIS — E876 Hypokalemia: Secondary | ICD-10-CM | POA: Diagnosis not present

## 2020-01-12 DIAGNOSIS — R1311 Dysphagia, oral phase: Secondary | ICD-10-CM | POA: Diagnosis not present

## 2020-01-12 DIAGNOSIS — I959 Hypotension, unspecified: Secondary | ICD-10-CM | POA: Diagnosis not present

## 2020-01-12 DIAGNOSIS — Z20822 Contact with and (suspected) exposure to covid-19: Secondary | ICD-10-CM | POA: Diagnosis not present

## 2020-01-12 DIAGNOSIS — Z8679 Personal history of other diseases of the circulatory system: Secondary | ICD-10-CM | POA: Diagnosis not present

## 2020-01-13 DIAGNOSIS — Z20822 Contact with and (suspected) exposure to covid-19: Secondary | ICD-10-CM | POA: Diagnosis not present

## 2020-01-13 DIAGNOSIS — K5900 Constipation, unspecified: Secondary | ICD-10-CM | POA: Diagnosis not present

## 2020-01-13 DIAGNOSIS — R55 Syncope and collapse: Secondary | ICD-10-CM | POA: Diagnosis not present

## 2020-01-13 DIAGNOSIS — F0391 Unspecified dementia with behavioral disturbance: Secondary | ICD-10-CM | POA: Diagnosis not present

## 2020-01-13 DIAGNOSIS — R1311 Dysphagia, oral phase: Secondary | ICD-10-CM | POA: Diagnosis not present

## 2020-01-13 DIAGNOSIS — Z8679 Personal history of other diseases of the circulatory system: Secondary | ICD-10-CM | POA: Diagnosis not present

## 2020-01-13 DIAGNOSIS — E876 Hypokalemia: Secondary | ICD-10-CM | POA: Diagnosis not present

## 2020-01-13 DIAGNOSIS — R7303 Prediabetes: Secondary | ICD-10-CM | POA: Diagnosis not present

## 2020-01-13 DIAGNOSIS — Z515 Encounter for palliative care: Secondary | ICD-10-CM | POA: Diagnosis not present

## 2020-01-13 DIAGNOSIS — L219 Seborrheic dermatitis, unspecified: Secondary | ICD-10-CM | POA: Diagnosis not present

## 2020-01-13 DIAGNOSIS — I959 Hypotension, unspecified: Secondary | ICD-10-CM | POA: Diagnosis not present

## 2020-01-14 DIAGNOSIS — E876 Hypokalemia: Secondary | ICD-10-CM | POA: Diagnosis not present

## 2020-01-14 DIAGNOSIS — L219 Seborrheic dermatitis, unspecified: Secondary | ICD-10-CM | POA: Diagnosis not present

## 2020-01-14 DIAGNOSIS — I959 Hypotension, unspecified: Secondary | ICD-10-CM | POA: Diagnosis not present

## 2020-01-14 DIAGNOSIS — R7303 Prediabetes: Secondary | ICD-10-CM | POA: Diagnosis not present

## 2020-01-14 DIAGNOSIS — Z20822 Contact with and (suspected) exposure to covid-19: Secondary | ICD-10-CM | POA: Diagnosis not present

## 2020-01-14 DIAGNOSIS — R55 Syncope and collapse: Secondary | ICD-10-CM | POA: Diagnosis not present

## 2020-01-14 DIAGNOSIS — F0391 Unspecified dementia with behavioral disturbance: Secondary | ICD-10-CM | POA: Diagnosis not present

## 2020-01-14 DIAGNOSIS — R1311 Dysphagia, oral phase: Secondary | ICD-10-CM | POA: Diagnosis not present

## 2020-01-14 DIAGNOSIS — K5900 Constipation, unspecified: Secondary | ICD-10-CM | POA: Diagnosis not present

## 2020-01-14 DIAGNOSIS — Z8679 Personal history of other diseases of the circulatory system: Secondary | ICD-10-CM | POA: Diagnosis not present

## 2020-01-15 DIAGNOSIS — I959 Hypotension, unspecified: Secondary | ICD-10-CM | POA: Diagnosis not present

## 2020-01-15 DIAGNOSIS — Z8679 Personal history of other diseases of the circulatory system: Secondary | ICD-10-CM | POA: Diagnosis not present

## 2020-01-15 DIAGNOSIS — Z20822 Contact with and (suspected) exposure to covid-19: Secondary | ICD-10-CM | POA: Diagnosis not present

## 2020-01-15 DIAGNOSIS — K5900 Constipation, unspecified: Secondary | ICD-10-CM | POA: Diagnosis not present

## 2020-01-15 DIAGNOSIS — R55 Syncope and collapse: Secondary | ICD-10-CM | POA: Diagnosis not present

## 2020-01-15 DIAGNOSIS — R1311 Dysphagia, oral phase: Secondary | ICD-10-CM | POA: Diagnosis not present

## 2020-01-15 DIAGNOSIS — F0391 Unspecified dementia with behavioral disturbance: Secondary | ICD-10-CM | POA: Diagnosis not present

## 2020-01-15 DIAGNOSIS — R7303 Prediabetes: Secondary | ICD-10-CM | POA: Diagnosis not present

## 2020-01-15 DIAGNOSIS — L219 Seborrheic dermatitis, unspecified: Secondary | ICD-10-CM | POA: Diagnosis not present

## 2020-01-15 DIAGNOSIS — E876 Hypokalemia: Secondary | ICD-10-CM | POA: Diagnosis not present

## 2020-01-16 DIAGNOSIS — F0391 Unspecified dementia with behavioral disturbance: Secondary | ICD-10-CM | POA: Diagnosis not present

## 2020-01-17 DIAGNOSIS — Z8679 Personal history of other diseases of the circulatory system: Secondary | ICD-10-CM | POA: Diagnosis not present

## 2020-01-17 DIAGNOSIS — K5900 Constipation, unspecified: Secondary | ICD-10-CM | POA: Diagnosis not present

## 2020-01-17 DIAGNOSIS — F0391 Unspecified dementia with behavioral disturbance: Secondary | ICD-10-CM | POA: Diagnosis not present

## 2020-01-17 DIAGNOSIS — Z7189 Other specified counseling: Secondary | ICD-10-CM | POA: Diagnosis not present

## 2020-01-17 DIAGNOSIS — N3001 Acute cystitis with hematuria: Secondary | ICD-10-CM | POA: Diagnosis not present

## 2020-01-17 DIAGNOSIS — W19XXXS Unspecified fall, sequela: Secondary | ICD-10-CM | POA: Diagnosis not present

## 2020-01-17 DIAGNOSIS — N390 Urinary tract infection, site not specified: Secondary | ICD-10-CM | POA: Diagnosis not present

## 2020-01-17 DIAGNOSIS — W19XXXD Unspecified fall, subsequent encounter: Secondary | ICD-10-CM | POA: Diagnosis not present

## 2020-01-17 DIAGNOSIS — Z2821 Immunization not carried out because of patient refusal: Secondary | ICD-10-CM | POA: Diagnosis not present

## 2020-01-17 DIAGNOSIS — Z20822 Contact with and (suspected) exposure to covid-19: Secondary | ICD-10-CM | POA: Diagnosis not present

## 2020-01-17 DIAGNOSIS — Z9181 History of falling: Secondary | ICD-10-CM | POA: Diagnosis not present

## 2020-01-17 DIAGNOSIS — R1310 Dysphagia, unspecified: Secondary | ICD-10-CM | POA: Diagnosis not present

## 2020-01-18 DIAGNOSIS — F0391 Unspecified dementia with behavioral disturbance: Secondary | ICD-10-CM | POA: Diagnosis not present

## 2020-01-19 DIAGNOSIS — R1311 Dysphagia, oral phase: Secondary | ICD-10-CM | POA: Diagnosis not present

## 2020-01-19 DIAGNOSIS — K5900 Constipation, unspecified: Secondary | ICD-10-CM | POA: Diagnosis not present

## 2020-01-19 DIAGNOSIS — B9620 Unspecified Escherichia coli [E. coli] as the cause of diseases classified elsewhere: Secondary | ICD-10-CM | POA: Diagnosis not present

## 2020-01-19 DIAGNOSIS — F0391 Unspecified dementia with behavioral disturbance: Secondary | ICD-10-CM | POA: Diagnosis not present

## 2020-01-19 DIAGNOSIS — E639 Nutritional deficiency, unspecified: Secondary | ICD-10-CM | POA: Diagnosis not present

## 2020-01-19 DIAGNOSIS — N390 Urinary tract infection, site not specified: Secondary | ICD-10-CM | POA: Diagnosis not present

## 2020-01-20 DIAGNOSIS — F0391 Unspecified dementia with behavioral disturbance: Secondary | ICD-10-CM | POA: Diagnosis not present

## 2020-01-21 DIAGNOSIS — K5900 Constipation, unspecified: Secondary | ICD-10-CM | POA: Diagnosis not present

## 2020-01-21 DIAGNOSIS — R1311 Dysphagia, oral phase: Secondary | ICD-10-CM | POA: Diagnosis not present

## 2020-01-21 DIAGNOSIS — N390 Urinary tract infection, site not specified: Secondary | ICD-10-CM | POA: Diagnosis not present

## 2020-01-21 DIAGNOSIS — B9620 Unspecified Escherichia coli [E. coli] as the cause of diseases classified elsewhere: Secondary | ICD-10-CM | POA: Diagnosis not present

## 2020-01-21 DIAGNOSIS — F0391 Unspecified dementia with behavioral disturbance: Secondary | ICD-10-CM | POA: Diagnosis not present

## 2020-01-21 DIAGNOSIS — E639 Nutritional deficiency, unspecified: Secondary | ICD-10-CM | POA: Diagnosis not present

## 2020-01-22 DIAGNOSIS — F0391 Unspecified dementia with behavioral disturbance: Secondary | ICD-10-CM | POA: Diagnosis not present

## 2020-01-23 DIAGNOSIS — R1311 Dysphagia, oral phase: Secondary | ICD-10-CM | POA: Diagnosis not present

## 2020-01-23 DIAGNOSIS — B9620 Unspecified Escherichia coli [E. coli] as the cause of diseases classified elsewhere: Secondary | ICD-10-CM | POA: Diagnosis not present

## 2020-01-23 DIAGNOSIS — N390 Urinary tract infection, site not specified: Secondary | ICD-10-CM | POA: Diagnosis not present

## 2020-01-23 DIAGNOSIS — F0391 Unspecified dementia with behavioral disturbance: Secondary | ICD-10-CM | POA: Diagnosis not present

## 2020-01-23 DIAGNOSIS — K5900 Constipation, unspecified: Secondary | ICD-10-CM | POA: Diagnosis not present

## 2020-01-23 DIAGNOSIS — E639 Nutritional deficiency, unspecified: Secondary | ICD-10-CM | POA: Diagnosis not present

## 2020-01-24 DIAGNOSIS — F0391 Unspecified dementia with behavioral disturbance: Secondary | ICD-10-CM | POA: Diagnosis not present

## 2020-01-25 DIAGNOSIS — R55 Syncope and collapse: Secondary | ICD-10-CM | POA: Diagnosis not present

## 2020-01-25 DIAGNOSIS — I959 Hypotension, unspecified: Secondary | ICD-10-CM | POA: Diagnosis not present

## 2020-01-25 DIAGNOSIS — E639 Nutritional deficiency, unspecified: Secondary | ICD-10-CM | POA: Diagnosis not present

## 2020-01-25 DIAGNOSIS — K5900 Constipation, unspecified: Secondary | ICD-10-CM | POA: Diagnosis not present

## 2020-01-25 DIAGNOSIS — E876 Hypokalemia: Secondary | ICD-10-CM | POA: Diagnosis not present

## 2020-01-25 DIAGNOSIS — R7303 Prediabetes: Secondary | ICD-10-CM | POA: Diagnosis not present

## 2020-01-25 DIAGNOSIS — F0391 Unspecified dementia with behavioral disturbance: Secondary | ICD-10-CM | POA: Diagnosis not present

## 2020-01-25 DIAGNOSIS — E871 Hypo-osmolality and hyponatremia: Secondary | ICD-10-CM | POA: Diagnosis not present

## 2020-01-25 DIAGNOSIS — Z8679 Personal history of other diseases of the circulatory system: Secondary | ICD-10-CM | POA: Diagnosis not present

## 2020-01-25 DIAGNOSIS — R1311 Dysphagia, oral phase: Secondary | ICD-10-CM | POA: Diagnosis not present

## 2020-01-25 DIAGNOSIS — L219 Seborrheic dermatitis, unspecified: Secondary | ICD-10-CM | POA: Diagnosis not present

## 2020-01-26 DIAGNOSIS — R1311 Dysphagia, oral phase: Secondary | ICD-10-CM | POA: Diagnosis not present

## 2020-01-26 DIAGNOSIS — R7303 Prediabetes: Secondary | ICD-10-CM | POA: Diagnosis not present

## 2020-01-26 DIAGNOSIS — K5900 Constipation, unspecified: Secondary | ICD-10-CM | POA: Diagnosis not present

## 2020-01-26 DIAGNOSIS — I959 Hypotension, unspecified: Secondary | ICD-10-CM | POA: Diagnosis not present

## 2020-01-26 DIAGNOSIS — E876 Hypokalemia: Secondary | ICD-10-CM | POA: Diagnosis not present

## 2020-01-26 DIAGNOSIS — E871 Hypo-osmolality and hyponatremia: Secondary | ICD-10-CM | POA: Diagnosis not present

## 2020-01-26 DIAGNOSIS — L219 Seborrheic dermatitis, unspecified: Secondary | ICD-10-CM | POA: Diagnosis not present

## 2020-01-26 DIAGNOSIS — F0391 Unspecified dementia with behavioral disturbance: Secondary | ICD-10-CM | POA: Diagnosis not present

## 2020-01-26 DIAGNOSIS — Z8679 Personal history of other diseases of the circulatory system: Secondary | ICD-10-CM | POA: Diagnosis not present

## 2020-01-26 DIAGNOSIS — R55 Syncope and collapse: Secondary | ICD-10-CM | POA: Diagnosis not present

## 2020-01-26 DIAGNOSIS — E639 Nutritional deficiency, unspecified: Secondary | ICD-10-CM | POA: Diagnosis not present

## 2020-01-27 DIAGNOSIS — R55 Syncope and collapse: Secondary | ICD-10-CM | POA: Diagnosis not present

## 2020-01-27 DIAGNOSIS — F0391 Unspecified dementia with behavioral disturbance: Secondary | ICD-10-CM | POA: Diagnosis not present

## 2020-01-27 DIAGNOSIS — E639 Nutritional deficiency, unspecified: Secondary | ICD-10-CM | POA: Diagnosis not present

## 2020-01-27 DIAGNOSIS — L219 Seborrheic dermatitis, unspecified: Secondary | ICD-10-CM | POA: Diagnosis not present

## 2020-01-27 DIAGNOSIS — R1311 Dysphagia, oral phase: Secondary | ICD-10-CM | POA: Diagnosis not present

## 2020-01-27 DIAGNOSIS — K5900 Constipation, unspecified: Secondary | ICD-10-CM | POA: Diagnosis not present

## 2020-01-27 DIAGNOSIS — E876 Hypokalemia: Secondary | ICD-10-CM | POA: Diagnosis not present

## 2020-01-27 DIAGNOSIS — E871 Hypo-osmolality and hyponatremia: Secondary | ICD-10-CM | POA: Diagnosis not present

## 2020-01-27 DIAGNOSIS — Z8679 Personal history of other diseases of the circulatory system: Secondary | ICD-10-CM | POA: Diagnosis not present

## 2020-01-27 DIAGNOSIS — R7303 Prediabetes: Secondary | ICD-10-CM | POA: Diagnosis not present

## 2020-01-27 DIAGNOSIS — I959 Hypotension, unspecified: Secondary | ICD-10-CM | POA: Diagnosis not present

## 2020-01-28 DIAGNOSIS — R7303 Prediabetes: Secondary | ICD-10-CM | POA: Diagnosis not present

## 2020-01-28 DIAGNOSIS — E876 Hypokalemia: Secondary | ICD-10-CM | POA: Diagnosis not present

## 2020-01-28 DIAGNOSIS — F0391 Unspecified dementia with behavioral disturbance: Secondary | ICD-10-CM | POA: Diagnosis not present

## 2020-01-28 DIAGNOSIS — E639 Nutritional deficiency, unspecified: Secondary | ICD-10-CM | POA: Diagnosis not present

## 2020-01-28 DIAGNOSIS — R1311 Dysphagia, oral phase: Secondary | ICD-10-CM | POA: Diagnosis not present

## 2020-01-28 DIAGNOSIS — I959 Hypotension, unspecified: Secondary | ICD-10-CM | POA: Diagnosis not present

## 2020-01-28 DIAGNOSIS — E871 Hypo-osmolality and hyponatremia: Secondary | ICD-10-CM | POA: Diagnosis not present

## 2020-01-28 DIAGNOSIS — R55 Syncope and collapse: Secondary | ICD-10-CM | POA: Diagnosis not present

## 2020-01-28 DIAGNOSIS — K5900 Constipation, unspecified: Secondary | ICD-10-CM | POA: Diagnosis not present

## 2020-01-28 DIAGNOSIS — L219 Seborrheic dermatitis, unspecified: Secondary | ICD-10-CM | POA: Diagnosis not present

## 2020-01-28 DIAGNOSIS — Z8679 Personal history of other diseases of the circulatory system: Secondary | ICD-10-CM | POA: Diagnosis not present

## 2020-01-29 DIAGNOSIS — F0391 Unspecified dementia with behavioral disturbance: Secondary | ICD-10-CM | POA: Diagnosis not present

## 2020-01-30 DIAGNOSIS — F0391 Unspecified dementia with behavioral disturbance: Secondary | ICD-10-CM | POA: Diagnosis not present

## 2020-01-30 DIAGNOSIS — E871 Hypo-osmolality and hyponatremia: Secondary | ICD-10-CM | POA: Diagnosis not present

## 2020-01-30 DIAGNOSIS — I959 Hypotension, unspecified: Secondary | ICD-10-CM | POA: Diagnosis not present

## 2020-01-30 DIAGNOSIS — E876 Hypokalemia: Secondary | ICD-10-CM | POA: Diagnosis not present

## 2020-01-30 DIAGNOSIS — R7303 Prediabetes: Secondary | ICD-10-CM | POA: Diagnosis not present

## 2020-01-30 DIAGNOSIS — N390 Urinary tract infection, site not specified: Secondary | ICD-10-CM | POA: Diagnosis not present

## 2020-01-30 DIAGNOSIS — L219 Seborrheic dermatitis, unspecified: Secondary | ICD-10-CM | POA: Diagnosis not present

## 2020-01-30 DIAGNOSIS — B9620 Unspecified Escherichia coli [E. coli] as the cause of diseases classified elsewhere: Secondary | ICD-10-CM | POA: Diagnosis not present

## 2020-01-30 DIAGNOSIS — K5900 Constipation, unspecified: Secondary | ICD-10-CM | POA: Diagnosis not present

## 2020-01-30 DIAGNOSIS — E639 Nutritional deficiency, unspecified: Secondary | ICD-10-CM | POA: Diagnosis not present

## 2020-01-30 DIAGNOSIS — Z20822 Contact with and (suspected) exposure to covid-19: Secondary | ICD-10-CM | POA: Diagnosis not present

## 2020-01-31 DIAGNOSIS — F0391 Unspecified dementia with behavioral disturbance: Secondary | ICD-10-CM | POA: Diagnosis not present

## 2020-02-01 DIAGNOSIS — K5900 Constipation, unspecified: Secondary | ICD-10-CM | POA: Diagnosis not present

## 2020-02-01 DIAGNOSIS — E639 Nutritional deficiency, unspecified: Secondary | ICD-10-CM | POA: Diagnosis not present

## 2020-02-01 DIAGNOSIS — F0391 Unspecified dementia with behavioral disturbance: Secondary | ICD-10-CM | POA: Diagnosis not present

## 2020-02-01 DIAGNOSIS — R1311 Dysphagia, oral phase: Secondary | ICD-10-CM | POA: Diagnosis not present

## 2020-02-02 DIAGNOSIS — F0391 Unspecified dementia with behavioral disturbance: Secondary | ICD-10-CM | POA: Diagnosis not present

## 2020-02-02 DIAGNOSIS — R1310 Dysphagia, unspecified: Secondary | ICD-10-CM | POA: Diagnosis not present

## 2020-02-02 DIAGNOSIS — E43 Unspecified severe protein-calorie malnutrition: Secondary | ICD-10-CM | POA: Diagnosis not present

## 2020-02-02 DIAGNOSIS — Z515 Encounter for palliative care: Secondary | ICD-10-CM | POA: Diagnosis not present

## 2020-02-02 DIAGNOSIS — Z7189 Other specified counseling: Secondary | ICD-10-CM | POA: Diagnosis not present

## 2020-02-03 DIAGNOSIS — K5900 Constipation, unspecified: Secondary | ICD-10-CM | POA: Diagnosis not present

## 2020-02-03 DIAGNOSIS — B3781 Candidal esophagitis: Secondary | ICD-10-CM | POA: Diagnosis not present

## 2020-02-03 DIAGNOSIS — E639 Nutritional deficiency, unspecified: Secondary | ICD-10-CM | POA: Diagnosis not present

## 2020-02-03 DIAGNOSIS — B370 Candidal stomatitis: Secondary | ICD-10-CM | POA: Diagnosis not present

## 2020-02-03 DIAGNOSIS — F0391 Unspecified dementia with behavioral disturbance: Secondary | ICD-10-CM | POA: Diagnosis not present

## 2020-02-04 DIAGNOSIS — K5900 Constipation, unspecified: Secondary | ICD-10-CM | POA: Diagnosis not present

## 2020-02-04 DIAGNOSIS — E639 Nutritional deficiency, unspecified: Secondary | ICD-10-CM | POA: Diagnosis not present

## 2020-02-04 DIAGNOSIS — F0391 Unspecified dementia with behavioral disturbance: Secondary | ICD-10-CM | POA: Diagnosis not present

## 2020-02-05 DIAGNOSIS — F0391 Unspecified dementia with behavioral disturbance: Secondary | ICD-10-CM | POA: Diagnosis not present

## 2020-02-06 DIAGNOSIS — E639 Nutritional deficiency, unspecified: Secondary | ICD-10-CM | POA: Diagnosis not present

## 2020-02-06 DIAGNOSIS — L219 Seborrheic dermatitis, unspecified: Secondary | ICD-10-CM | POA: Diagnosis not present

## 2020-02-06 DIAGNOSIS — R1310 Dysphagia, unspecified: Secondary | ICD-10-CM | POA: Diagnosis not present

## 2020-02-06 DIAGNOSIS — K5900 Constipation, unspecified: Secondary | ICD-10-CM | POA: Diagnosis not present

## 2020-02-06 DIAGNOSIS — F0391 Unspecified dementia with behavioral disturbance: Secondary | ICD-10-CM | POA: Diagnosis not present

## 2020-02-06 DIAGNOSIS — Z8679 Personal history of other diseases of the circulatory system: Secondary | ICD-10-CM | POA: Diagnosis not present

## 2020-02-06 DIAGNOSIS — B351 Tinea unguium: Secondary | ICD-10-CM | POA: Diagnosis not present

## 2020-02-07 DIAGNOSIS — K5900 Constipation, unspecified: Secondary | ICD-10-CM | POA: Diagnosis not present

## 2020-02-07 DIAGNOSIS — E639 Nutritional deficiency, unspecified: Secondary | ICD-10-CM | POA: Diagnosis not present

## 2020-02-07 DIAGNOSIS — R1310 Dysphagia, unspecified: Secondary | ICD-10-CM | POA: Diagnosis not present

## 2020-02-07 DIAGNOSIS — L219 Seborrheic dermatitis, unspecified: Secondary | ICD-10-CM | POA: Diagnosis not present

## 2020-02-07 DIAGNOSIS — B351 Tinea unguium: Secondary | ICD-10-CM | POA: Diagnosis not present

## 2020-02-07 DIAGNOSIS — Z8679 Personal history of other diseases of the circulatory system: Secondary | ICD-10-CM | POA: Diagnosis not present

## 2020-02-08 DIAGNOSIS — F0391 Unspecified dementia with behavioral disturbance: Secondary | ICD-10-CM | POA: Diagnosis not present

## 2020-02-09 DIAGNOSIS — F0391 Unspecified dementia with behavioral disturbance: Secondary | ICD-10-CM | POA: Diagnosis not present

## 2020-02-10 DIAGNOSIS — F0391 Unspecified dementia with behavioral disturbance: Secondary | ICD-10-CM | POA: Diagnosis not present

## 2020-02-11 DIAGNOSIS — F0391 Unspecified dementia with behavioral disturbance: Secondary | ICD-10-CM | POA: Diagnosis not present

## 2020-02-12 DIAGNOSIS — F0391 Unspecified dementia with behavioral disturbance: Secondary | ICD-10-CM | POA: Diagnosis not present

## 2020-02-12 DIAGNOSIS — L219 Seborrheic dermatitis, unspecified: Secondary | ICD-10-CM | POA: Diagnosis not present

## 2020-02-12 DIAGNOSIS — R1311 Dysphagia, oral phase: Secondary | ICD-10-CM | POA: Diagnosis not present

## 2020-02-12 DIAGNOSIS — R7303 Prediabetes: Secondary | ICD-10-CM | POA: Diagnosis not present

## 2020-02-12 DIAGNOSIS — I959 Hypotension, unspecified: Secondary | ICD-10-CM | POA: Diagnosis not present

## 2020-02-12 DIAGNOSIS — E639 Nutritional deficiency, unspecified: Secondary | ICD-10-CM | POA: Diagnosis not present

## 2020-02-13 DIAGNOSIS — F0391 Unspecified dementia with behavioral disturbance: Secondary | ICD-10-CM | POA: Diagnosis not present

## 2020-02-14 DIAGNOSIS — I959 Hypotension, unspecified: Secondary | ICD-10-CM | POA: Diagnosis not present

## 2020-02-14 DIAGNOSIS — E876 Hypokalemia: Secondary | ICD-10-CM | POA: Diagnosis not present

## 2020-02-14 DIAGNOSIS — Z20822 Contact with and (suspected) exposure to covid-19: Secondary | ICD-10-CM | POA: Diagnosis not present

## 2020-02-14 DIAGNOSIS — K5900 Constipation, unspecified: Secondary | ICD-10-CM | POA: Diagnosis not present

## 2020-02-14 DIAGNOSIS — N390 Urinary tract infection, site not specified: Secondary | ICD-10-CM | POA: Diagnosis not present

## 2020-02-14 DIAGNOSIS — W19XXXD Unspecified fall, subsequent encounter: Secondary | ICD-10-CM | POA: Diagnosis not present

## 2020-02-14 DIAGNOSIS — E871 Hypo-osmolality and hyponatremia: Secondary | ICD-10-CM | POA: Diagnosis not present

## 2020-02-14 DIAGNOSIS — E639 Nutritional deficiency, unspecified: Secondary | ICD-10-CM | POA: Diagnosis not present

## 2020-02-14 DIAGNOSIS — Z2821 Immunization not carried out because of patient refusal: Secondary | ICD-10-CM | POA: Diagnosis not present

## 2020-02-14 DIAGNOSIS — Z8679 Personal history of other diseases of the circulatory system: Secondary | ICD-10-CM | POA: Diagnosis not present

## 2020-02-14 DIAGNOSIS — F0391 Unspecified dementia with behavioral disturbance: Secondary | ICD-10-CM | POA: Diagnosis not present

## 2020-02-14 DIAGNOSIS — N3001 Acute cystitis with hematuria: Secondary | ICD-10-CM | POA: Diagnosis not present

## 2020-02-15 DIAGNOSIS — F0391 Unspecified dementia with behavioral disturbance: Secondary | ICD-10-CM | POA: Diagnosis not present

## 2020-02-15 DIAGNOSIS — E43 Unspecified severe protein-calorie malnutrition: Secondary | ICD-10-CM | POA: Diagnosis not present

## 2020-02-15 DIAGNOSIS — Z515 Encounter for palliative care: Secondary | ICD-10-CM | POA: Diagnosis not present

## 2020-02-15 DIAGNOSIS — R1310 Dysphagia, unspecified: Secondary | ICD-10-CM | POA: Diagnosis not present

## 2020-02-16 DIAGNOSIS — E639 Nutritional deficiency, unspecified: Secondary | ICD-10-CM | POA: Diagnosis not present

## 2020-02-16 DIAGNOSIS — K5900 Constipation, unspecified: Secondary | ICD-10-CM | POA: Diagnosis not present

## 2020-02-16 DIAGNOSIS — F0391 Unspecified dementia with behavioral disturbance: Secondary | ICD-10-CM | POA: Diagnosis not present

## 2020-02-16 DIAGNOSIS — I959 Hypotension, unspecified: Secondary | ICD-10-CM | POA: Diagnosis not present

## 2020-02-17 DIAGNOSIS — F0391 Unspecified dementia with behavioral disturbance: Secondary | ICD-10-CM | POA: Diagnosis not present

## 2020-02-18 DIAGNOSIS — K5900 Constipation, unspecified: Secondary | ICD-10-CM | POA: Diagnosis not present

## 2020-02-18 DIAGNOSIS — I959 Hypotension, unspecified: Secondary | ICD-10-CM | POA: Diagnosis not present

## 2020-02-18 DIAGNOSIS — E639 Nutritional deficiency, unspecified: Secondary | ICD-10-CM | POA: Diagnosis not present

## 2020-02-18 DIAGNOSIS — F0391 Unspecified dementia with behavioral disturbance: Secondary | ICD-10-CM | POA: Diagnosis not present

## 2020-02-19 DIAGNOSIS — F0391 Unspecified dementia with behavioral disturbance: Secondary | ICD-10-CM | POA: Diagnosis not present

## 2020-02-20 DIAGNOSIS — R1311 Dysphagia, oral phase: Secondary | ICD-10-CM | POA: Diagnosis not present

## 2020-02-20 DIAGNOSIS — R7303 Prediabetes: Secondary | ICD-10-CM | POA: Diagnosis not present

## 2020-02-20 DIAGNOSIS — F0391 Unspecified dementia with behavioral disturbance: Secondary | ICD-10-CM | POA: Diagnosis not present

## 2020-02-20 DIAGNOSIS — I959 Hypotension, unspecified: Secondary | ICD-10-CM | POA: Diagnosis not present

## 2020-02-20 DIAGNOSIS — L219 Seborrheic dermatitis, unspecified: Secondary | ICD-10-CM | POA: Diagnosis not present

## 2020-02-20 DIAGNOSIS — E639 Nutritional deficiency, unspecified: Secondary | ICD-10-CM | POA: Diagnosis not present

## 2020-02-21 DIAGNOSIS — F0391 Unspecified dementia with behavioral disturbance: Secondary | ICD-10-CM | POA: Diagnosis not present

## 2020-02-22 DIAGNOSIS — Z8679 Personal history of other diseases of the circulatory system: Secondary | ICD-10-CM | POA: Diagnosis not present

## 2020-02-22 DIAGNOSIS — F0391 Unspecified dementia with behavioral disturbance: Secondary | ICD-10-CM | POA: Diagnosis not present

## 2020-02-22 DIAGNOSIS — S0993XA Unspecified injury of face, initial encounter: Secondary | ICD-10-CM | POA: Diagnosis not present

## 2020-02-22 DIAGNOSIS — E639 Nutritional deficiency, unspecified: Secondary | ICD-10-CM | POA: Diagnosis not present

## 2020-02-22 DIAGNOSIS — E871 Hypo-osmolality and hyponatremia: Secondary | ICD-10-CM | POA: Diagnosis not present

## 2020-02-22 DIAGNOSIS — R1311 Dysphagia, oral phase: Secondary | ICD-10-CM | POA: Diagnosis not present

## 2020-02-22 DIAGNOSIS — I959 Hypotension, unspecified: Secondary | ICD-10-CM | POA: Diagnosis not present

## 2020-02-22 DIAGNOSIS — E876 Hypokalemia: Secondary | ICD-10-CM | POA: Diagnosis not present

## 2020-02-22 DIAGNOSIS — R55 Syncope and collapse: Secondary | ICD-10-CM | POA: Diagnosis not present

## 2020-02-22 DIAGNOSIS — K5900 Constipation, unspecified: Secondary | ICD-10-CM | POA: Diagnosis not present

## 2020-02-23 DIAGNOSIS — E871 Hypo-osmolality and hyponatremia: Secondary | ICD-10-CM | POA: Diagnosis not present

## 2020-02-23 DIAGNOSIS — R55 Syncope and collapse: Secondary | ICD-10-CM | POA: Diagnosis not present

## 2020-02-23 DIAGNOSIS — S022XXD Fracture of nasal bones, subsequent encounter for fracture with routine healing: Secondary | ICD-10-CM | POA: Diagnosis not present

## 2020-02-23 DIAGNOSIS — S0993XA Unspecified injury of face, initial encounter: Secondary | ICD-10-CM | POA: Diagnosis not present

## 2020-02-23 DIAGNOSIS — R1311 Dysphagia, oral phase: Secondary | ICD-10-CM | POA: Diagnosis not present

## 2020-02-23 DIAGNOSIS — F0391 Unspecified dementia with behavioral disturbance: Secondary | ICD-10-CM | POA: Diagnosis not present

## 2020-02-23 DIAGNOSIS — E639 Nutritional deficiency, unspecified: Secondary | ICD-10-CM | POA: Diagnosis not present

## 2020-02-23 DIAGNOSIS — E876 Hypokalemia: Secondary | ICD-10-CM | POA: Diagnosis not present

## 2020-02-23 DIAGNOSIS — I959 Hypotension, unspecified: Secondary | ICD-10-CM | POA: Diagnosis not present

## 2020-02-23 DIAGNOSIS — K5900 Constipation, unspecified: Secondary | ICD-10-CM | POA: Diagnosis not present

## 2020-02-23 DIAGNOSIS — Z8679 Personal history of other diseases of the circulatory system: Secondary | ICD-10-CM | POA: Diagnosis not present

## 2020-02-24 DIAGNOSIS — E871 Hypo-osmolality and hyponatremia: Secondary | ICD-10-CM | POA: Diagnosis not present

## 2020-02-24 DIAGNOSIS — F0391 Unspecified dementia with behavioral disturbance: Secondary | ICD-10-CM | POA: Diagnosis not present

## 2020-02-24 DIAGNOSIS — R1311 Dysphagia, oral phase: Secondary | ICD-10-CM | POA: Diagnosis not present

## 2020-02-24 DIAGNOSIS — K5900 Constipation, unspecified: Secondary | ICD-10-CM | POA: Diagnosis not present

## 2020-02-24 DIAGNOSIS — R55 Syncope and collapse: Secondary | ICD-10-CM | POA: Diagnosis not present

## 2020-02-24 DIAGNOSIS — S0993XA Unspecified injury of face, initial encounter: Secondary | ICD-10-CM | POA: Diagnosis not present

## 2020-02-24 DIAGNOSIS — S022XXD Fracture of nasal bones, subsequent encounter for fracture with routine healing: Secondary | ICD-10-CM | POA: Diagnosis not present

## 2020-02-24 DIAGNOSIS — Z8679 Personal history of other diseases of the circulatory system: Secondary | ICD-10-CM | POA: Diagnosis not present

## 2020-02-24 DIAGNOSIS — I959 Hypotension, unspecified: Secondary | ICD-10-CM | POA: Diagnosis not present

## 2020-02-24 DIAGNOSIS — E876 Hypokalemia: Secondary | ICD-10-CM | POA: Diagnosis not present

## 2020-02-24 DIAGNOSIS — E639 Nutritional deficiency, unspecified: Secondary | ICD-10-CM | POA: Diagnosis not present

## 2020-02-25 DIAGNOSIS — I959 Hypotension, unspecified: Secondary | ICD-10-CM | POA: Diagnosis not present

## 2020-02-25 DIAGNOSIS — S0993XA Unspecified injury of face, initial encounter: Secondary | ICD-10-CM | POA: Diagnosis not present

## 2020-02-25 DIAGNOSIS — F0391 Unspecified dementia with behavioral disturbance: Secondary | ICD-10-CM | POA: Diagnosis not present

## 2020-02-25 DIAGNOSIS — R1311 Dysphagia, oral phase: Secondary | ICD-10-CM | POA: Diagnosis not present

## 2020-02-25 DIAGNOSIS — E639 Nutritional deficiency, unspecified: Secondary | ICD-10-CM | POA: Diagnosis not present

## 2020-02-25 DIAGNOSIS — E871 Hypo-osmolality and hyponatremia: Secondary | ICD-10-CM | POA: Diagnosis not present

## 2020-02-25 DIAGNOSIS — S022XXD Fracture of nasal bones, subsequent encounter for fracture with routine healing: Secondary | ICD-10-CM | POA: Diagnosis not present

## 2020-02-25 DIAGNOSIS — Z8679 Personal history of other diseases of the circulatory system: Secondary | ICD-10-CM | POA: Diagnosis not present

## 2020-02-25 DIAGNOSIS — K5900 Constipation, unspecified: Secondary | ICD-10-CM | POA: Diagnosis not present

## 2020-02-25 DIAGNOSIS — E876 Hypokalemia: Secondary | ICD-10-CM | POA: Diagnosis not present

## 2020-02-25 DIAGNOSIS — R55 Syncope and collapse: Secondary | ICD-10-CM | POA: Diagnosis not present

## 2020-02-26 DIAGNOSIS — E871 Hypo-osmolality and hyponatremia: Secondary | ICD-10-CM | POA: Diagnosis not present

## 2020-02-26 DIAGNOSIS — Z8679 Personal history of other diseases of the circulatory system: Secondary | ICD-10-CM | POA: Diagnosis not present

## 2020-02-26 DIAGNOSIS — R1311 Dysphagia, oral phase: Secondary | ICD-10-CM | POA: Diagnosis not present

## 2020-02-26 DIAGNOSIS — S022XXD Fracture of nasal bones, subsequent encounter for fracture with routine healing: Secondary | ICD-10-CM | POA: Diagnosis not present

## 2020-02-26 DIAGNOSIS — R55 Syncope and collapse: Secondary | ICD-10-CM | POA: Diagnosis not present

## 2020-02-26 DIAGNOSIS — E639 Nutritional deficiency, unspecified: Secondary | ICD-10-CM | POA: Diagnosis not present

## 2020-02-26 DIAGNOSIS — S0993XA Unspecified injury of face, initial encounter: Secondary | ICD-10-CM | POA: Diagnosis not present

## 2020-02-26 DIAGNOSIS — I959 Hypotension, unspecified: Secondary | ICD-10-CM | POA: Diagnosis not present

## 2020-02-26 DIAGNOSIS — E876 Hypokalemia: Secondary | ICD-10-CM | POA: Diagnosis not present

## 2020-02-26 DIAGNOSIS — F0391 Unspecified dementia with behavioral disturbance: Secondary | ICD-10-CM | POA: Diagnosis not present

## 2020-02-26 DIAGNOSIS — K5900 Constipation, unspecified: Secondary | ICD-10-CM | POA: Diagnosis not present

## 2020-02-27 DIAGNOSIS — F0391 Unspecified dementia with behavioral disturbance: Secondary | ICD-10-CM | POA: Diagnosis not present

## 2020-02-28 DIAGNOSIS — F0391 Unspecified dementia with behavioral disturbance: Secondary | ICD-10-CM | POA: Diagnosis not present

## 2020-02-28 DIAGNOSIS — R1310 Dysphagia, unspecified: Secondary | ICD-10-CM | POA: Diagnosis not present

## 2020-02-28 DIAGNOSIS — K5900 Constipation, unspecified: Secondary | ICD-10-CM | POA: Diagnosis not present

## 2020-02-28 DIAGNOSIS — B9620 Unspecified Escherichia coli [E. coli] as the cause of diseases classified elsewhere: Secondary | ICD-10-CM | POA: Diagnosis not present

## 2020-02-28 DIAGNOSIS — N390 Urinary tract infection, site not specified: Secondary | ICD-10-CM | POA: Diagnosis not present

## 2020-02-28 DIAGNOSIS — Y92239 Unspecified place in hospital as the place of occurrence of the external cause: Secondary | ICD-10-CM | POA: Diagnosis not present

## 2020-02-28 DIAGNOSIS — S022XXD Fracture of nasal bones, subsequent encounter for fracture with routine healing: Secondary | ICD-10-CM | POA: Diagnosis not present

## 2020-02-28 DIAGNOSIS — W19XXXD Unspecified fall, subsequent encounter: Secondary | ICD-10-CM | POA: Diagnosis not present

## 2020-02-29 DIAGNOSIS — F0391 Unspecified dementia with behavioral disturbance: Secondary | ICD-10-CM | POA: Diagnosis not present

## 2020-03-01 DIAGNOSIS — W19XXXD Unspecified fall, subsequent encounter: Secondary | ICD-10-CM | POA: Diagnosis not present

## 2020-03-01 DIAGNOSIS — N390 Urinary tract infection, site not specified: Secondary | ICD-10-CM | POA: Diagnosis not present

## 2020-03-01 DIAGNOSIS — B9620 Unspecified Escherichia coli [E. coli] as the cause of diseases classified elsewhere: Secondary | ICD-10-CM | POA: Diagnosis not present

## 2020-03-01 DIAGNOSIS — Y92239 Unspecified place in hospital as the place of occurrence of the external cause: Secondary | ICD-10-CM | POA: Diagnosis not present

## 2020-03-01 DIAGNOSIS — K5900 Constipation, unspecified: Secondary | ICD-10-CM | POA: Diagnosis not present

## 2020-03-01 DIAGNOSIS — S022XXD Fracture of nasal bones, subsequent encounter for fracture with routine healing: Secondary | ICD-10-CM | POA: Diagnosis not present

## 2020-03-01 DIAGNOSIS — R1310 Dysphagia, unspecified: Secondary | ICD-10-CM | POA: Diagnosis not present

## 2020-03-01 DIAGNOSIS — F0391 Unspecified dementia with behavioral disturbance: Secondary | ICD-10-CM | POA: Diagnosis not present

## 2020-03-02 DIAGNOSIS — F0391 Unspecified dementia with behavioral disturbance: Secondary | ICD-10-CM | POA: Diagnosis not present

## 2020-03-03 DIAGNOSIS — N390 Urinary tract infection, site not specified: Secondary | ICD-10-CM | POA: Diagnosis not present

## 2020-03-03 DIAGNOSIS — E639 Nutritional deficiency, unspecified: Secondary | ICD-10-CM | POA: Diagnosis not present

## 2020-03-03 DIAGNOSIS — K5900 Constipation, unspecified: Secondary | ICD-10-CM | POA: Diagnosis not present

## 2020-03-03 DIAGNOSIS — L219 Seborrheic dermatitis, unspecified: Secondary | ICD-10-CM | POA: Diagnosis not present

## 2020-03-03 DIAGNOSIS — F0391 Unspecified dementia with behavioral disturbance: Secondary | ICD-10-CM | POA: Diagnosis not present

## 2020-03-03 DIAGNOSIS — S022XXD Fracture of nasal bones, subsequent encounter for fracture with routine healing: Secondary | ICD-10-CM | POA: Diagnosis not present

## 2020-03-03 DIAGNOSIS — Z515 Encounter for palliative care: Secondary | ICD-10-CM | POA: Diagnosis not present

## 2020-03-03 DIAGNOSIS — E43 Unspecified severe protein-calorie malnutrition: Secondary | ICD-10-CM | POA: Diagnosis not present

## 2020-03-03 DIAGNOSIS — R7303 Prediabetes: Secondary | ICD-10-CM | POA: Diagnosis not present

## 2020-03-03 DIAGNOSIS — B9620 Unspecified Escherichia coli [E. coli] as the cause of diseases classified elsewhere: Secondary | ICD-10-CM | POA: Diagnosis not present

## 2020-03-03 DIAGNOSIS — S022XXA Fracture of nasal bones, initial encounter for closed fracture: Secondary | ICD-10-CM | POA: Diagnosis not present

## 2020-03-03 DIAGNOSIS — R1311 Dysphagia, oral phase: Secondary | ICD-10-CM | POA: Diagnosis not present

## 2020-03-04 DIAGNOSIS — F0391 Unspecified dementia with behavioral disturbance: Secondary | ICD-10-CM | POA: Diagnosis not present

## 2020-03-05 DIAGNOSIS — R7303 Prediabetes: Secondary | ICD-10-CM | POA: Diagnosis not present

## 2020-03-05 DIAGNOSIS — L219 Seborrheic dermatitis, unspecified: Secondary | ICD-10-CM | POA: Diagnosis not present

## 2020-03-05 DIAGNOSIS — F0391 Unspecified dementia with behavioral disturbance: Secondary | ICD-10-CM | POA: Diagnosis not present

## 2020-03-05 DIAGNOSIS — B9620 Unspecified Escherichia coli [E. coli] as the cause of diseases classified elsewhere: Secondary | ICD-10-CM | POA: Diagnosis not present

## 2020-03-05 DIAGNOSIS — E43 Unspecified severe protein-calorie malnutrition: Secondary | ICD-10-CM | POA: Diagnosis not present

## 2020-03-05 DIAGNOSIS — K5900 Constipation, unspecified: Secondary | ICD-10-CM | POA: Diagnosis not present

## 2020-03-05 DIAGNOSIS — E639 Nutritional deficiency, unspecified: Secondary | ICD-10-CM | POA: Diagnosis not present

## 2020-03-05 DIAGNOSIS — N390 Urinary tract infection, site not specified: Secondary | ICD-10-CM | POA: Diagnosis not present

## 2020-03-05 DIAGNOSIS — S022XXA Fracture of nasal bones, initial encounter for closed fracture: Secondary | ICD-10-CM | POA: Diagnosis not present

## 2020-03-05 DIAGNOSIS — R1311 Dysphagia, oral phase: Secondary | ICD-10-CM | POA: Diagnosis not present

## 2020-03-06 DIAGNOSIS — F0391 Unspecified dementia with behavioral disturbance: Secondary | ICD-10-CM | POA: Diagnosis not present

## 2020-03-07 DIAGNOSIS — S022XXD Fracture of nasal bones, subsequent encounter for fracture with routine healing: Secondary | ICD-10-CM | POA: Diagnosis not present

## 2020-03-07 DIAGNOSIS — K5900 Constipation, unspecified: Secondary | ICD-10-CM | POA: Diagnosis not present

## 2020-03-07 DIAGNOSIS — R55 Syncope and collapse: Secondary | ICD-10-CM | POA: Diagnosis not present

## 2020-03-07 DIAGNOSIS — R1311 Dysphagia, oral phase: Secondary | ICD-10-CM | POA: Diagnosis not present

## 2020-03-07 DIAGNOSIS — E876 Hypokalemia: Secondary | ICD-10-CM | POA: Diagnosis not present

## 2020-03-07 DIAGNOSIS — F0391 Unspecified dementia with behavioral disturbance: Secondary | ICD-10-CM | POA: Diagnosis not present

## 2020-03-07 DIAGNOSIS — I959 Hypotension, unspecified: Secondary | ICD-10-CM | POA: Diagnosis not present

## 2020-03-07 DIAGNOSIS — S0993XA Unspecified injury of face, initial encounter: Secondary | ICD-10-CM | POA: Diagnosis not present

## 2020-03-07 DIAGNOSIS — E871 Hypo-osmolality and hyponatremia: Secondary | ICD-10-CM | POA: Diagnosis not present

## 2020-03-07 DIAGNOSIS — Z8679 Personal history of other diseases of the circulatory system: Secondary | ICD-10-CM | POA: Diagnosis not present

## 2020-03-07 DIAGNOSIS — E639 Nutritional deficiency, unspecified: Secondary | ICD-10-CM | POA: Diagnosis not present

## 2020-03-08 DIAGNOSIS — F0391 Unspecified dementia with behavioral disturbance: Secondary | ICD-10-CM | POA: Diagnosis not present

## 2020-03-08 DIAGNOSIS — Z8679 Personal history of other diseases of the circulatory system: Secondary | ICD-10-CM | POA: Diagnosis not present

## 2020-03-08 DIAGNOSIS — S022XXD Fracture of nasal bones, subsequent encounter for fracture with routine healing: Secondary | ICD-10-CM | POA: Diagnosis not present

## 2020-03-08 DIAGNOSIS — W19XXXA Unspecified fall, initial encounter: Secondary | ICD-10-CM | POA: Diagnosis not present

## 2020-03-08 DIAGNOSIS — R55 Syncope and collapse: Secondary | ICD-10-CM | POA: Diagnosis not present

## 2020-03-08 DIAGNOSIS — Y92239 Unspecified place in hospital as the place of occurrence of the external cause: Secondary | ICD-10-CM | POA: Diagnosis not present

## 2020-03-08 DIAGNOSIS — K5900 Constipation, unspecified: Secondary | ICD-10-CM | POA: Diagnosis not present

## 2020-03-08 DIAGNOSIS — I959 Hypotension, unspecified: Secondary | ICD-10-CM | POA: Diagnosis not present

## 2020-03-08 DIAGNOSIS — R1311 Dysphagia, oral phase: Secondary | ICD-10-CM | POA: Diagnosis not present

## 2020-03-08 DIAGNOSIS — J189 Pneumonia, unspecified organism: Secondary | ICD-10-CM | POA: Diagnosis not present

## 2020-03-08 DIAGNOSIS — S0993XA Unspecified injury of face, initial encounter: Secondary | ICD-10-CM | POA: Diagnosis not present

## 2020-03-08 DIAGNOSIS — E639 Nutritional deficiency, unspecified: Secondary | ICD-10-CM | POA: Diagnosis not present

## 2020-03-09 DIAGNOSIS — J189 Pneumonia, unspecified organism: Secondary | ICD-10-CM | POA: Diagnosis not present

## 2020-03-09 DIAGNOSIS — Z8679 Personal history of other diseases of the circulatory system: Secondary | ICD-10-CM | POA: Diagnosis not present

## 2020-03-09 DIAGNOSIS — W19XXXA Unspecified fall, initial encounter: Secondary | ICD-10-CM | POA: Diagnosis not present

## 2020-03-09 DIAGNOSIS — I959 Hypotension, unspecified: Secondary | ICD-10-CM | POA: Diagnosis not present

## 2020-03-09 DIAGNOSIS — R1311 Dysphagia, oral phase: Secondary | ICD-10-CM | POA: Diagnosis not present

## 2020-03-09 DIAGNOSIS — R55 Syncope and collapse: Secondary | ICD-10-CM | POA: Diagnosis not present

## 2020-03-09 DIAGNOSIS — Y92239 Unspecified place in hospital as the place of occurrence of the external cause: Secondary | ICD-10-CM | POA: Diagnosis not present

## 2020-03-09 DIAGNOSIS — E639 Nutritional deficiency, unspecified: Secondary | ICD-10-CM | POA: Diagnosis not present

## 2020-03-09 DIAGNOSIS — S0993XA Unspecified injury of face, initial encounter: Secondary | ICD-10-CM | POA: Diagnosis not present

## 2020-03-09 DIAGNOSIS — K5900 Constipation, unspecified: Secondary | ICD-10-CM | POA: Diagnosis not present

## 2020-03-09 DIAGNOSIS — F0391 Unspecified dementia with behavioral disturbance: Secondary | ICD-10-CM | POA: Diagnosis not present

## 2020-03-09 DIAGNOSIS — S022XXD Fracture of nasal bones, subsequent encounter for fracture with routine healing: Secondary | ICD-10-CM | POA: Diagnosis not present

## 2020-03-10 DIAGNOSIS — F0391 Unspecified dementia with behavioral disturbance: Secondary | ICD-10-CM | POA: Diagnosis not present

## 2020-03-11 DIAGNOSIS — E639 Nutritional deficiency, unspecified: Secondary | ICD-10-CM | POA: Diagnosis not present

## 2020-03-11 DIAGNOSIS — S0993XA Unspecified injury of face, initial encounter: Secondary | ICD-10-CM | POA: Diagnosis not present

## 2020-03-11 DIAGNOSIS — I959 Hypotension, unspecified: Secondary | ICD-10-CM | POA: Diagnosis not present

## 2020-03-11 DIAGNOSIS — W19XXXA Unspecified fall, initial encounter: Secondary | ICD-10-CM | POA: Diagnosis not present

## 2020-03-11 DIAGNOSIS — R1311 Dysphagia, oral phase: Secondary | ICD-10-CM | POA: Diagnosis not present

## 2020-03-11 DIAGNOSIS — S022XXD Fracture of nasal bones, subsequent encounter for fracture with routine healing: Secondary | ICD-10-CM | POA: Diagnosis not present

## 2020-03-11 DIAGNOSIS — Y92239 Unspecified place in hospital as the place of occurrence of the external cause: Secondary | ICD-10-CM | POA: Diagnosis not present

## 2020-03-11 DIAGNOSIS — F0391 Unspecified dementia with behavioral disturbance: Secondary | ICD-10-CM | POA: Diagnosis not present

## 2020-03-11 DIAGNOSIS — K5900 Constipation, unspecified: Secondary | ICD-10-CM | POA: Diagnosis not present

## 2020-03-11 DIAGNOSIS — J189 Pneumonia, unspecified organism: Secondary | ICD-10-CM | POA: Diagnosis not present

## 2020-03-11 DIAGNOSIS — Z515 Encounter for palliative care: Secondary | ICD-10-CM | POA: Diagnosis not present

## 2020-03-11 DIAGNOSIS — Z8679 Personal history of other diseases of the circulatory system: Secondary | ICD-10-CM | POA: Diagnosis not present

## 2020-03-11 DIAGNOSIS — R55 Syncope and collapse: Secondary | ICD-10-CM | POA: Diagnosis not present

## 2020-03-12 DIAGNOSIS — F0391 Unspecified dementia with behavioral disturbance: Secondary | ICD-10-CM | POA: Diagnosis not present

## 2020-03-13 DIAGNOSIS — K5900 Constipation, unspecified: Secondary | ICD-10-CM | POA: Diagnosis not present

## 2020-03-13 DIAGNOSIS — Z9181 History of falling: Secondary | ICD-10-CM | POA: Diagnosis not present

## 2020-03-13 DIAGNOSIS — F0391 Unspecified dementia with behavioral disturbance: Secondary | ICD-10-CM | POA: Diagnosis not present

## 2020-03-13 DIAGNOSIS — Y95 Nosocomial condition: Secondary | ICD-10-CM | POA: Diagnosis not present

## 2020-03-13 DIAGNOSIS — J189 Pneumonia, unspecified organism: Secondary | ICD-10-CM | POA: Diagnosis not present

## 2020-03-13 DIAGNOSIS — R1310 Dysphagia, unspecified: Secondary | ICD-10-CM | POA: Diagnosis not present

## 2020-03-14 DIAGNOSIS — F0391 Unspecified dementia with behavioral disturbance: Secondary | ICD-10-CM | POA: Diagnosis not present

## 2020-03-15 DIAGNOSIS — J189 Pneumonia, unspecified organism: Secondary | ICD-10-CM | POA: Diagnosis not present

## 2020-03-15 DIAGNOSIS — F0391 Unspecified dementia with behavioral disturbance: Secondary | ICD-10-CM | POA: Diagnosis not present

## 2020-03-15 DIAGNOSIS — K5900 Constipation, unspecified: Secondary | ICD-10-CM | POA: Diagnosis not present

## 2020-03-15 DIAGNOSIS — R1310 Dysphagia, unspecified: Secondary | ICD-10-CM | POA: Diagnosis not present

## 2020-03-15 DIAGNOSIS — Z9181 History of falling: Secondary | ICD-10-CM | POA: Diagnosis not present

## 2020-03-15 DIAGNOSIS — Y95 Nosocomial condition: Secondary | ICD-10-CM | POA: Diagnosis not present

## 2020-03-16 DIAGNOSIS — E43 Unspecified severe protein-calorie malnutrition: Secondary | ICD-10-CM | POA: Diagnosis not present

## 2020-03-16 DIAGNOSIS — F0391 Unspecified dementia with behavioral disturbance: Secondary | ICD-10-CM | POA: Diagnosis not present

## 2020-03-16 DIAGNOSIS — R1310 Dysphagia, unspecified: Secondary | ICD-10-CM | POA: Diagnosis not present

## 2020-03-16 DIAGNOSIS — Z515 Encounter for palliative care: Secondary | ICD-10-CM | POA: Diagnosis not present

## 2020-03-17 DIAGNOSIS — B3781 Candidal esophagitis: Secondary | ICD-10-CM | POA: Diagnosis not present

## 2020-03-17 DIAGNOSIS — Z2821 Immunization not carried out because of patient refusal: Secondary | ICD-10-CM | POA: Diagnosis not present

## 2020-03-17 DIAGNOSIS — N390 Urinary tract infection, site not specified: Secondary | ICD-10-CM | POA: Diagnosis not present

## 2020-03-17 DIAGNOSIS — S0993XA Unspecified injury of face, initial encounter: Secondary | ICD-10-CM | POA: Diagnosis not present

## 2020-03-17 DIAGNOSIS — K5900 Constipation, unspecified: Secondary | ICD-10-CM | POA: Diagnosis not present

## 2020-03-17 DIAGNOSIS — Z7189 Other specified counseling: Secondary | ICD-10-CM | POA: Diagnosis not present

## 2020-03-17 DIAGNOSIS — W19XXXS Unspecified fall, sequela: Secondary | ICD-10-CM | POA: Diagnosis not present

## 2020-03-17 DIAGNOSIS — F0391 Unspecified dementia with behavioral disturbance: Secondary | ICD-10-CM | POA: Diagnosis not present

## 2020-03-17 DIAGNOSIS — E43 Unspecified severe protein-calorie malnutrition: Secondary | ICD-10-CM | POA: Diagnosis not present

## 2020-03-17 DIAGNOSIS — S022XXD Fracture of nasal bones, subsequent encounter for fracture with routine healing: Secondary | ICD-10-CM | POA: Diagnosis not present

## 2020-03-17 DIAGNOSIS — R1310 Dysphagia, unspecified: Secondary | ICD-10-CM | POA: Diagnosis not present

## 2020-03-17 DIAGNOSIS — Z20822 Contact with and (suspected) exposure to covid-19: Secondary | ICD-10-CM | POA: Diagnosis not present

## 2020-03-18 DIAGNOSIS — F0391 Unspecified dementia with behavioral disturbance: Secondary | ICD-10-CM | POA: Diagnosis not present

## 2020-03-19 DIAGNOSIS — W19XXXS Unspecified fall, sequela: Secondary | ICD-10-CM | POA: Diagnosis not present

## 2020-03-19 DIAGNOSIS — Z9181 History of falling: Secondary | ICD-10-CM | POA: Diagnosis not present

## 2020-03-19 DIAGNOSIS — E639 Nutritional deficiency, unspecified: Secondary | ICD-10-CM | POA: Diagnosis not present

## 2020-03-19 DIAGNOSIS — Y92239 Unspecified place in hospital as the place of occurrence of the external cause: Secondary | ICD-10-CM | POA: Diagnosis not present

## 2020-03-19 DIAGNOSIS — F0391 Unspecified dementia with behavioral disturbance: Secondary | ICD-10-CM | POA: Diagnosis not present

## 2020-03-19 DIAGNOSIS — R1311 Dysphagia, oral phase: Secondary | ICD-10-CM | POA: Diagnosis not present

## 2020-03-19 DIAGNOSIS — E876 Hypokalemia: Secondary | ICD-10-CM | POA: Diagnosis not present

## 2020-03-19 DIAGNOSIS — E43 Unspecified severe protein-calorie malnutrition: Secondary | ICD-10-CM | POA: Diagnosis not present

## 2020-03-19 DIAGNOSIS — I959 Hypotension, unspecified: Secondary | ICD-10-CM | POA: Diagnosis not present

## 2020-03-20 DIAGNOSIS — F0391 Unspecified dementia with behavioral disturbance: Secondary | ICD-10-CM | POA: Diagnosis not present

## 2020-03-21 DIAGNOSIS — R1311 Dysphagia, oral phase: Secondary | ICD-10-CM | POA: Diagnosis not present

## 2020-03-21 DIAGNOSIS — I959 Hypotension, unspecified: Secondary | ICD-10-CM | POA: Diagnosis not present

## 2020-03-21 DIAGNOSIS — E43 Unspecified severe protein-calorie malnutrition: Secondary | ICD-10-CM | POA: Diagnosis not present

## 2020-03-21 DIAGNOSIS — R55 Syncope and collapse: Secondary | ICD-10-CM | POA: Diagnosis not present

## 2020-03-21 DIAGNOSIS — K5900 Constipation, unspecified: Secondary | ICD-10-CM | POA: Diagnosis not present

## 2020-03-21 DIAGNOSIS — F0391 Unspecified dementia with behavioral disturbance: Secondary | ICD-10-CM | POA: Diagnosis not present

## 2020-03-21 DIAGNOSIS — Z9181 History of falling: Secondary | ICD-10-CM | POA: Diagnosis not present

## 2020-03-21 DIAGNOSIS — Z515 Encounter for palliative care: Secondary | ICD-10-CM | POA: Diagnosis not present

## 2020-03-22 DIAGNOSIS — R55 Syncope and collapse: Secondary | ICD-10-CM | POA: Diagnosis not present

## 2020-03-22 DIAGNOSIS — F0391 Unspecified dementia with behavioral disturbance: Secondary | ICD-10-CM | POA: Diagnosis not present

## 2020-03-22 DIAGNOSIS — S022XXD Fracture of nasal bones, subsequent encounter for fracture with routine healing: Secondary | ICD-10-CM | POA: Diagnosis not present

## 2020-03-22 DIAGNOSIS — Z8679 Personal history of other diseases of the circulatory system: Secondary | ICD-10-CM | POA: Diagnosis not present

## 2020-03-22 DIAGNOSIS — R1311 Dysphagia, oral phase: Secondary | ICD-10-CM | POA: Diagnosis not present

## 2020-03-22 DIAGNOSIS — Y92239 Unspecified place in hospital as the place of occurrence of the external cause: Secondary | ICD-10-CM | POA: Diagnosis not present

## 2020-03-22 DIAGNOSIS — E639 Nutritional deficiency, unspecified: Secondary | ICD-10-CM | POA: Diagnosis not present

## 2020-03-22 DIAGNOSIS — W19XXXA Unspecified fall, initial encounter: Secondary | ICD-10-CM | POA: Diagnosis not present

## 2020-03-22 DIAGNOSIS — I959 Hypotension, unspecified: Secondary | ICD-10-CM | POA: Diagnosis not present

## 2020-03-22 DIAGNOSIS — J189 Pneumonia, unspecified organism: Secondary | ICD-10-CM | POA: Diagnosis not present

## 2020-03-22 DIAGNOSIS — S0993XA Unspecified injury of face, initial encounter: Secondary | ICD-10-CM | POA: Diagnosis not present

## 2020-03-22 DIAGNOSIS — K5900 Constipation, unspecified: Secondary | ICD-10-CM | POA: Diagnosis not present

## 2020-03-23 DIAGNOSIS — R55 Syncope and collapse: Secondary | ICD-10-CM | POA: Diagnosis not present

## 2020-03-23 DIAGNOSIS — F0391 Unspecified dementia with behavioral disturbance: Secondary | ICD-10-CM | POA: Diagnosis not present

## 2020-03-23 DIAGNOSIS — I959 Hypotension, unspecified: Secondary | ICD-10-CM | POA: Diagnosis not present

## 2020-03-24 DIAGNOSIS — W19XXXA Unspecified fall, initial encounter: Secondary | ICD-10-CM | POA: Diagnosis not present

## 2020-03-24 DIAGNOSIS — R55 Syncope and collapse: Secondary | ICD-10-CM | POA: Diagnosis not present

## 2020-03-24 DIAGNOSIS — E639 Nutritional deficiency, unspecified: Secondary | ICD-10-CM | POA: Diagnosis not present

## 2020-03-24 DIAGNOSIS — I959 Hypotension, unspecified: Secondary | ICD-10-CM | POA: Diagnosis not present

## 2020-03-24 DIAGNOSIS — Z8679 Personal history of other diseases of the circulatory system: Secondary | ICD-10-CM | POA: Diagnosis not present

## 2020-03-24 DIAGNOSIS — F0391 Unspecified dementia with behavioral disturbance: Secondary | ICD-10-CM | POA: Diagnosis not present

## 2020-03-24 DIAGNOSIS — Y92239 Unspecified place in hospital as the place of occurrence of the external cause: Secondary | ICD-10-CM | POA: Diagnosis not present

## 2020-03-24 DIAGNOSIS — R1311 Dysphagia, oral phase: Secondary | ICD-10-CM | POA: Diagnosis not present

## 2020-03-24 DIAGNOSIS — S0993XA Unspecified injury of face, initial encounter: Secondary | ICD-10-CM | POA: Diagnosis not present

## 2020-03-24 DIAGNOSIS — S022XXD Fracture of nasal bones, subsequent encounter for fracture with routine healing: Secondary | ICD-10-CM | POA: Diagnosis not present

## 2020-03-24 DIAGNOSIS — J189 Pneumonia, unspecified organism: Secondary | ICD-10-CM | POA: Diagnosis not present

## 2020-03-24 DIAGNOSIS — K5900 Constipation, unspecified: Secondary | ICD-10-CM | POA: Diagnosis not present

## 2020-03-25 DIAGNOSIS — Z515 Encounter for palliative care: Secondary | ICD-10-CM | POA: Diagnosis not present

## 2020-03-25 DIAGNOSIS — R55 Syncope and collapse: Secondary | ICD-10-CM | POA: Diagnosis not present

## 2020-03-25 DIAGNOSIS — F0391 Unspecified dementia with behavioral disturbance: Secondary | ICD-10-CM | POA: Diagnosis not present

## 2020-03-25 DIAGNOSIS — I959 Hypotension, unspecified: Secondary | ICD-10-CM | POA: Diagnosis not present

## 2020-03-26 DIAGNOSIS — R55 Syncope and collapse: Secondary | ICD-10-CM | POA: Diagnosis not present

## 2020-03-26 DIAGNOSIS — F0391 Unspecified dementia with behavioral disturbance: Secondary | ICD-10-CM | POA: Diagnosis not present

## 2020-03-26 DIAGNOSIS — I959 Hypotension, unspecified: Secondary | ICD-10-CM | POA: Diagnosis not present

## 2020-03-27 DIAGNOSIS — I959 Hypotension, unspecified: Secondary | ICD-10-CM | POA: Diagnosis not present

## 2020-03-27 DIAGNOSIS — R55 Syncope and collapse: Secondary | ICD-10-CM | POA: Diagnosis not present

## 2020-03-27 DIAGNOSIS — F0391 Unspecified dementia with behavioral disturbance: Secondary | ICD-10-CM | POA: Diagnosis not present

## 2020-03-28 DIAGNOSIS — F0391 Unspecified dementia with behavioral disturbance: Secondary | ICD-10-CM | POA: Diagnosis not present

## 2020-03-29 DIAGNOSIS — F0391 Unspecified dementia with behavioral disturbance: Secondary | ICD-10-CM | POA: Diagnosis not present

## 2020-03-30 DIAGNOSIS — R1311 Dysphagia, oral phase: Secondary | ICD-10-CM | POA: Diagnosis not present

## 2020-03-30 DIAGNOSIS — R7303 Prediabetes: Secondary | ICD-10-CM | POA: Diagnosis not present

## 2020-03-30 DIAGNOSIS — W19XXXS Unspecified fall, sequela: Secondary | ICD-10-CM | POA: Diagnosis not present

## 2020-03-30 DIAGNOSIS — E639 Nutritional deficiency, unspecified: Secondary | ICD-10-CM | POA: Diagnosis not present

## 2020-03-30 DIAGNOSIS — I959 Hypotension, unspecified: Secondary | ICD-10-CM | POA: Diagnosis not present

## 2020-03-30 DIAGNOSIS — N390 Urinary tract infection, site not specified: Secondary | ICD-10-CM | POA: Diagnosis not present

## 2020-03-30 DIAGNOSIS — W19XXXD Unspecified fall, subsequent encounter: Secondary | ICD-10-CM | POA: Diagnosis not present

## 2020-03-30 DIAGNOSIS — U071 COVID-19: Secondary | ICD-10-CM | POA: Diagnosis not present

## 2020-03-30 DIAGNOSIS — F0391 Unspecified dementia with behavioral disturbance: Secondary | ICD-10-CM | POA: Diagnosis not present

## 2020-03-30 DIAGNOSIS — K5900 Constipation, unspecified: Secondary | ICD-10-CM | POA: Diagnosis not present

## 2020-03-30 DIAGNOSIS — S0993XA Unspecified injury of face, initial encounter: Secondary | ICD-10-CM | POA: Diagnosis not present

## 2020-03-30 DIAGNOSIS — L219 Seborrheic dermatitis, unspecified: Secondary | ICD-10-CM | POA: Diagnosis not present

## 2020-03-31 DIAGNOSIS — F0391 Unspecified dementia with behavioral disturbance: Secondary | ICD-10-CM | POA: Diagnosis not present

## 2020-04-01 DIAGNOSIS — F0391 Unspecified dementia with behavioral disturbance: Secondary | ICD-10-CM | POA: Diagnosis not present

## 2020-04-02 DIAGNOSIS — Y92239 Unspecified place in hospital as the place of occurrence of the external cause: Secondary | ICD-10-CM | POA: Diagnosis not present

## 2020-04-02 DIAGNOSIS — F0391 Unspecified dementia with behavioral disturbance: Secondary | ICD-10-CM | POA: Diagnosis not present

## 2020-04-02 DIAGNOSIS — W19XXXD Unspecified fall, subsequent encounter: Secondary | ICD-10-CM | POA: Diagnosis not present

## 2020-04-02 DIAGNOSIS — Z2821 Immunization not carried out because of patient refusal: Secondary | ICD-10-CM | POA: Diagnosis not present

## 2020-04-02 DIAGNOSIS — L219 Seborrheic dermatitis, unspecified: Secondary | ICD-10-CM | POA: Diagnosis not present

## 2020-04-02 DIAGNOSIS — S0993XA Unspecified injury of face, initial encounter: Secondary | ICD-10-CM | POA: Diagnosis not present

## 2020-04-03 DIAGNOSIS — F0391 Unspecified dementia with behavioral disturbance: Secondary | ICD-10-CM | POA: Diagnosis not present

## 2020-04-04 DIAGNOSIS — W19XXXD Unspecified fall, subsequent encounter: Secondary | ICD-10-CM | POA: Diagnosis not present

## 2020-04-04 DIAGNOSIS — S0993XA Unspecified injury of face, initial encounter: Secondary | ICD-10-CM | POA: Diagnosis not present

## 2020-04-04 DIAGNOSIS — Z515 Encounter for palliative care: Secondary | ICD-10-CM | POA: Diagnosis not present

## 2020-04-04 DIAGNOSIS — R1311 Dysphagia, oral phase: Secondary | ICD-10-CM | POA: Diagnosis not present

## 2020-04-04 DIAGNOSIS — U071 COVID-19: Secondary | ICD-10-CM | POA: Diagnosis not present

## 2020-04-04 DIAGNOSIS — E43 Unspecified severe protein-calorie malnutrition: Secondary | ICD-10-CM | POA: Diagnosis not present

## 2020-04-04 DIAGNOSIS — L219 Seborrheic dermatitis, unspecified: Secondary | ICD-10-CM | POA: Diagnosis not present

## 2020-04-04 DIAGNOSIS — Z9181 History of falling: Secondary | ICD-10-CM | POA: Diagnosis not present

## 2020-04-04 DIAGNOSIS — Y92239 Unspecified place in hospital as the place of occurrence of the external cause: Secondary | ICD-10-CM | POA: Diagnosis not present

## 2020-04-04 DIAGNOSIS — Z2821 Immunization not carried out because of patient refusal: Secondary | ICD-10-CM | POA: Diagnosis not present

## 2020-04-04 DIAGNOSIS — F0391 Unspecified dementia with behavioral disturbance: Secondary | ICD-10-CM | POA: Diagnosis not present

## 2020-04-04 DIAGNOSIS — Z7189 Other specified counseling: Secondary | ICD-10-CM | POA: Diagnosis not present

## 2020-04-04 DIAGNOSIS — K5900 Constipation, unspecified: Secondary | ICD-10-CM | POA: Diagnosis not present

## 2020-04-05 DIAGNOSIS — S022XXD Fracture of nasal bones, subsequent encounter for fracture with routine healing: Secondary | ICD-10-CM | POA: Diagnosis not present

## 2020-04-05 DIAGNOSIS — Y92239 Unspecified place in hospital as the place of occurrence of the external cause: Secondary | ICD-10-CM | POA: Diagnosis not present

## 2020-04-05 DIAGNOSIS — F0391 Unspecified dementia with behavioral disturbance: Secondary | ICD-10-CM | POA: Diagnosis not present

## 2020-04-05 DIAGNOSIS — E876 Hypokalemia: Secondary | ICD-10-CM | POA: Diagnosis not present

## 2020-04-05 DIAGNOSIS — U071 COVID-19: Secondary | ICD-10-CM | POA: Diagnosis not present

## 2020-04-05 DIAGNOSIS — Z8679 Personal history of other diseases of the circulatory system: Secondary | ICD-10-CM | POA: Diagnosis not present

## 2020-04-05 DIAGNOSIS — W19XXXD Unspecified fall, subsequent encounter: Secondary | ICD-10-CM | POA: Diagnosis not present

## 2020-04-05 DIAGNOSIS — E43 Unspecified severe protein-calorie malnutrition: Secondary | ICD-10-CM | POA: Diagnosis not present

## 2020-04-05 DIAGNOSIS — Z7189 Other specified counseling: Secondary | ICD-10-CM | POA: Diagnosis not present

## 2020-04-05 DIAGNOSIS — R55 Syncope and collapse: Secondary | ICD-10-CM | POA: Diagnosis not present

## 2020-04-05 DIAGNOSIS — K5900 Constipation, unspecified: Secondary | ICD-10-CM | POA: Diagnosis not present

## 2020-04-05 DIAGNOSIS — R1311 Dysphagia, oral phase: Secondary | ICD-10-CM | POA: Diagnosis not present

## 2020-04-05 DIAGNOSIS — I959 Hypotension, unspecified: Secondary | ICD-10-CM | POA: Diagnosis not present

## 2020-04-05 DIAGNOSIS — Z515 Encounter for palliative care: Secondary | ICD-10-CM | POA: Diagnosis not present

## 2020-04-05 DIAGNOSIS — E639 Nutritional deficiency, unspecified: Secondary | ICD-10-CM | POA: Diagnosis not present

## 2020-04-06 DIAGNOSIS — W19XXXD Unspecified fall, subsequent encounter: Secondary | ICD-10-CM | POA: Diagnosis not present

## 2020-04-06 DIAGNOSIS — R1311 Dysphagia, oral phase: Secondary | ICD-10-CM | POA: Diagnosis not present

## 2020-04-06 DIAGNOSIS — E639 Nutritional deficiency, unspecified: Secondary | ICD-10-CM | POA: Diagnosis not present

## 2020-04-06 DIAGNOSIS — R55 Syncope and collapse: Secondary | ICD-10-CM | POA: Diagnosis not present

## 2020-04-06 DIAGNOSIS — F0391 Unspecified dementia with behavioral disturbance: Secondary | ICD-10-CM | POA: Diagnosis not present

## 2020-04-06 DIAGNOSIS — Z8679 Personal history of other diseases of the circulatory system: Secondary | ICD-10-CM | POA: Diagnosis not present

## 2020-04-06 DIAGNOSIS — U071 COVID-19: Secondary | ICD-10-CM | POA: Diagnosis not present

## 2020-04-06 DIAGNOSIS — S022XXD Fracture of nasal bones, subsequent encounter for fracture with routine healing: Secondary | ICD-10-CM | POA: Diagnosis not present

## 2020-04-06 DIAGNOSIS — E876 Hypokalemia: Secondary | ICD-10-CM | POA: Diagnosis not present

## 2020-04-06 DIAGNOSIS — I959 Hypotension, unspecified: Secondary | ICD-10-CM | POA: Diagnosis not present

## 2020-04-06 DIAGNOSIS — Y92239 Unspecified place in hospital as the place of occurrence of the external cause: Secondary | ICD-10-CM | POA: Diagnosis not present

## 2020-04-06 DIAGNOSIS — K5900 Constipation, unspecified: Secondary | ICD-10-CM | POA: Diagnosis not present

## 2020-04-07 DIAGNOSIS — W19XXXD Unspecified fall, subsequent encounter: Secondary | ICD-10-CM | POA: Diagnosis not present

## 2020-04-07 DIAGNOSIS — K5900 Constipation, unspecified: Secondary | ICD-10-CM | POA: Diagnosis not present

## 2020-04-07 DIAGNOSIS — R1311 Dysphagia, oral phase: Secondary | ICD-10-CM | POA: Diagnosis not present

## 2020-04-07 DIAGNOSIS — Y92239 Unspecified place in hospital as the place of occurrence of the external cause: Secondary | ICD-10-CM | POA: Diagnosis not present

## 2020-04-07 DIAGNOSIS — U071 COVID-19: Secondary | ICD-10-CM | POA: Diagnosis not present

## 2020-04-07 DIAGNOSIS — R55 Syncope and collapse: Secondary | ICD-10-CM | POA: Diagnosis not present

## 2020-04-07 DIAGNOSIS — F0391 Unspecified dementia with behavioral disturbance: Secondary | ICD-10-CM | POA: Diagnosis not present

## 2020-04-07 DIAGNOSIS — I959 Hypotension, unspecified: Secondary | ICD-10-CM | POA: Diagnosis not present

## 2020-04-07 DIAGNOSIS — Z8679 Personal history of other diseases of the circulatory system: Secondary | ICD-10-CM | POA: Diagnosis not present

## 2020-04-07 DIAGNOSIS — E876 Hypokalemia: Secondary | ICD-10-CM | POA: Diagnosis not present

## 2020-04-07 DIAGNOSIS — S022XXD Fracture of nasal bones, subsequent encounter for fracture with routine healing: Secondary | ICD-10-CM | POA: Diagnosis not present

## 2020-04-07 DIAGNOSIS — E639 Nutritional deficiency, unspecified: Secondary | ICD-10-CM | POA: Diagnosis not present

## 2020-04-08 DIAGNOSIS — R1311 Dysphagia, oral phase: Secondary | ICD-10-CM | POA: Diagnosis not present

## 2020-04-08 DIAGNOSIS — F0391 Unspecified dementia with behavioral disturbance: Secondary | ICD-10-CM | POA: Diagnosis not present

## 2020-04-08 DIAGNOSIS — Y92239 Unspecified place in hospital as the place of occurrence of the external cause: Secondary | ICD-10-CM | POA: Diagnosis not present

## 2020-04-08 DIAGNOSIS — W19XXXD Unspecified fall, subsequent encounter: Secondary | ICD-10-CM | POA: Diagnosis not present

## 2020-04-08 DIAGNOSIS — U071 COVID-19: Secondary | ICD-10-CM | POA: Diagnosis not present

## 2020-04-08 DIAGNOSIS — E639 Nutritional deficiency, unspecified: Secondary | ICD-10-CM | POA: Diagnosis not present

## 2020-04-08 DIAGNOSIS — R55 Syncope and collapse: Secondary | ICD-10-CM | POA: Diagnosis not present

## 2020-04-08 DIAGNOSIS — I959 Hypotension, unspecified: Secondary | ICD-10-CM | POA: Diagnosis not present

## 2020-04-08 DIAGNOSIS — K5900 Constipation, unspecified: Secondary | ICD-10-CM | POA: Diagnosis not present

## 2020-04-08 DIAGNOSIS — S022XXD Fracture of nasal bones, subsequent encounter for fracture with routine healing: Secondary | ICD-10-CM | POA: Diagnosis not present

## 2020-04-09 DIAGNOSIS — F0391 Unspecified dementia with behavioral disturbance: Secondary | ICD-10-CM | POA: Diagnosis not present

## 2020-04-10 DIAGNOSIS — F0391 Unspecified dementia with behavioral disturbance: Secondary | ICD-10-CM | POA: Diagnosis not present

## 2020-04-10 DIAGNOSIS — W19XXXD Unspecified fall, subsequent encounter: Secondary | ICD-10-CM | POA: Diagnosis not present

## 2020-04-10 DIAGNOSIS — L219 Seborrheic dermatitis, unspecified: Secondary | ICD-10-CM | POA: Diagnosis not present

## 2020-04-10 DIAGNOSIS — S0993XA Unspecified injury of face, initial encounter: Secondary | ICD-10-CM | POA: Diagnosis not present

## 2020-04-10 DIAGNOSIS — Z2821 Immunization not carried out because of patient refusal: Secondary | ICD-10-CM | POA: Diagnosis not present

## 2020-04-10 DIAGNOSIS — Y92239 Unspecified place in hospital as the place of occurrence of the external cause: Secondary | ICD-10-CM | POA: Diagnosis not present

## 2020-04-11 DIAGNOSIS — I959 Hypotension, unspecified: Secondary | ICD-10-CM | POA: Diagnosis not present

## 2020-04-11 DIAGNOSIS — W19XXXD Unspecified fall, subsequent encounter: Secondary | ICD-10-CM | POA: Diagnosis not present

## 2020-04-11 DIAGNOSIS — Z2821 Immunization not carried out because of patient refusal: Secondary | ICD-10-CM | POA: Diagnosis not present

## 2020-04-11 DIAGNOSIS — S0993XA Unspecified injury of face, initial encounter: Secondary | ICD-10-CM | POA: Diagnosis not present

## 2020-04-11 DIAGNOSIS — Z515 Encounter for palliative care: Secondary | ICD-10-CM | POA: Diagnosis not present

## 2020-04-11 DIAGNOSIS — Y92239 Unspecified place in hospital as the place of occurrence of the external cause: Secondary | ICD-10-CM | POA: Diagnosis not present

## 2020-04-11 DIAGNOSIS — R1310 Dysphagia, unspecified: Secondary | ICD-10-CM | POA: Diagnosis not present

## 2020-04-11 DIAGNOSIS — L219 Seborrheic dermatitis, unspecified: Secondary | ICD-10-CM | POA: Diagnosis not present

## 2020-04-11 DIAGNOSIS — R55 Syncope and collapse: Secondary | ICD-10-CM | POA: Diagnosis not present

## 2020-04-11 DIAGNOSIS — K5900 Constipation, unspecified: Secondary | ICD-10-CM | POA: Diagnosis not present

## 2020-04-11 DIAGNOSIS — F0391 Unspecified dementia with behavioral disturbance: Secondary | ICD-10-CM | POA: Diagnosis not present

## 2020-04-12 DIAGNOSIS — F0391 Unspecified dementia with behavioral disturbance: Secondary | ICD-10-CM | POA: Diagnosis not present

## 2020-04-13 DIAGNOSIS — S0993XA Unspecified injury of face, initial encounter: Secondary | ICD-10-CM | POA: Diagnosis not present

## 2020-04-13 DIAGNOSIS — Z2821 Immunization not carried out because of patient refusal: Secondary | ICD-10-CM | POA: Diagnosis not present

## 2020-04-13 DIAGNOSIS — W19XXXD Unspecified fall, subsequent encounter: Secondary | ICD-10-CM | POA: Diagnosis not present

## 2020-04-13 DIAGNOSIS — L219 Seborrheic dermatitis, unspecified: Secondary | ICD-10-CM | POA: Diagnosis not present

## 2020-04-13 DIAGNOSIS — F0391 Unspecified dementia with behavioral disturbance: Secondary | ICD-10-CM | POA: Diagnosis not present

## 2020-04-13 DIAGNOSIS — Y92239 Unspecified place in hospital as the place of occurrence of the external cause: Secondary | ICD-10-CM | POA: Diagnosis not present

## 2020-04-14 DIAGNOSIS — F0391 Unspecified dementia with behavioral disturbance: Secondary | ICD-10-CM | POA: Diagnosis not present

## 2020-04-15 DIAGNOSIS — Y92239 Unspecified place in hospital as the place of occurrence of the external cause: Secondary | ICD-10-CM | POA: Diagnosis not present

## 2020-04-15 DIAGNOSIS — F0391 Unspecified dementia with behavioral disturbance: Secondary | ICD-10-CM | POA: Diagnosis not present

## 2020-04-15 DIAGNOSIS — W19XXXD Unspecified fall, subsequent encounter: Secondary | ICD-10-CM | POA: Diagnosis not present

## 2020-04-15 DIAGNOSIS — L219 Seborrheic dermatitis, unspecified: Secondary | ICD-10-CM | POA: Diagnosis not present

## 2020-04-15 DIAGNOSIS — S0993XA Unspecified injury of face, initial encounter: Secondary | ICD-10-CM | POA: Diagnosis not present

## 2020-04-15 DIAGNOSIS — Z2821 Immunization not carried out because of patient refusal: Secondary | ICD-10-CM | POA: Diagnosis not present

## 2020-04-16 DIAGNOSIS — F0391 Unspecified dementia with behavioral disturbance: Secondary | ICD-10-CM | POA: Diagnosis not present

## 2020-04-17 DIAGNOSIS — E639 Nutritional deficiency, unspecified: Secondary | ICD-10-CM | POA: Diagnosis not present

## 2020-04-17 DIAGNOSIS — W19XXXD Unspecified fall, subsequent encounter: Secondary | ICD-10-CM | POA: Diagnosis not present

## 2020-04-17 DIAGNOSIS — N390 Urinary tract infection, site not specified: Secondary | ICD-10-CM | POA: Diagnosis not present

## 2020-04-17 DIAGNOSIS — K5900 Constipation, unspecified: Secondary | ICD-10-CM | POA: Diagnosis not present

## 2020-04-17 DIAGNOSIS — Z20822 Contact with and (suspected) exposure to covid-19: Secondary | ICD-10-CM | POA: Diagnosis not present

## 2020-04-17 DIAGNOSIS — Z8679 Personal history of other diseases of the circulatory system: Secondary | ICD-10-CM | POA: Diagnosis not present

## 2020-04-17 DIAGNOSIS — I959 Hypotension, unspecified: Secondary | ICD-10-CM | POA: Diagnosis not present

## 2020-04-17 DIAGNOSIS — E871 Hypo-osmolality and hyponatremia: Secondary | ICD-10-CM | POA: Diagnosis not present

## 2020-04-17 DIAGNOSIS — S0993XA Unspecified injury of face, initial encounter: Secondary | ICD-10-CM | POA: Diagnosis not present

## 2020-04-17 DIAGNOSIS — F0391 Unspecified dementia with behavioral disturbance: Secondary | ICD-10-CM | POA: Diagnosis not present

## 2020-04-17 DIAGNOSIS — E876 Hypokalemia: Secondary | ICD-10-CM | POA: Diagnosis not present

## 2020-04-17 DIAGNOSIS — R55 Syncope and collapse: Secondary | ICD-10-CM | POA: Diagnosis not present

## 2020-04-18 DIAGNOSIS — F0391 Unspecified dementia with behavioral disturbance: Secondary | ICD-10-CM | POA: Diagnosis not present

## 2020-04-19 DIAGNOSIS — U071 COVID-19: Secondary | ICD-10-CM | POA: Diagnosis not present

## 2020-04-19 DIAGNOSIS — F0391 Unspecified dementia with behavioral disturbance: Secondary | ICD-10-CM | POA: Diagnosis not present

## 2020-04-19 DIAGNOSIS — W19XXXD Unspecified fall, subsequent encounter: Secondary | ICD-10-CM | POA: Diagnosis not present

## 2020-04-19 DIAGNOSIS — Z8679 Personal history of other diseases of the circulatory system: Secondary | ICD-10-CM | POA: Diagnosis not present

## 2020-04-19 DIAGNOSIS — E639 Nutritional deficiency, unspecified: Secondary | ICD-10-CM | POA: Diagnosis not present

## 2020-04-19 DIAGNOSIS — R1311 Dysphagia, oral phase: Secondary | ICD-10-CM | POA: Diagnosis not present

## 2020-04-19 DIAGNOSIS — S022XXD Fracture of nasal bones, subsequent encounter for fracture with routine healing: Secondary | ICD-10-CM | POA: Diagnosis not present

## 2020-04-19 DIAGNOSIS — I959 Hypotension, unspecified: Secondary | ICD-10-CM | POA: Diagnosis not present

## 2020-04-19 DIAGNOSIS — Y92239 Unspecified place in hospital as the place of occurrence of the external cause: Secondary | ICD-10-CM | POA: Diagnosis not present

## 2020-04-19 DIAGNOSIS — E876 Hypokalemia: Secondary | ICD-10-CM | POA: Diagnosis not present

## 2020-04-19 DIAGNOSIS — K5900 Constipation, unspecified: Secondary | ICD-10-CM | POA: Diagnosis not present

## 2020-04-19 DIAGNOSIS — R55 Syncope and collapse: Secondary | ICD-10-CM | POA: Diagnosis not present

## 2020-04-26 DIAGNOSIS — D649 Anemia, unspecified: Secondary | ICD-10-CM | POA: Diagnosis not present

## 2020-04-26 DIAGNOSIS — F0391 Unspecified dementia with behavioral disturbance: Secondary | ICD-10-CM | POA: Diagnosis not present

## 2020-04-26 DIAGNOSIS — R4689 Other symptoms and signs involving appearance and behavior: Secondary | ICD-10-CM | POA: Diagnosis not present

## 2020-04-26 DIAGNOSIS — G3109 Other frontotemporal dementia: Secondary | ICD-10-CM | POA: Diagnosis not present

## 2020-05-06 DIAGNOSIS — F0390 Unspecified dementia without behavioral disturbance: Secondary | ICD-10-CM | POA: Diagnosis not present

## 2020-05-06 DIAGNOSIS — R296 Repeated falls: Secondary | ICD-10-CM | POA: Diagnosis not present

## 2020-05-06 DIAGNOSIS — S0101XA Laceration without foreign body of scalp, initial encounter: Secondary | ICD-10-CM | POA: Diagnosis not present

## 2020-05-06 DIAGNOSIS — Z743 Need for continuous supervision: Secondary | ICD-10-CM | POA: Diagnosis not present

## 2020-05-06 DIAGNOSIS — R279 Unspecified lack of coordination: Secondary | ICD-10-CM | POA: Diagnosis not present

## 2020-05-06 DIAGNOSIS — Z7982 Long term (current) use of aspirin: Secondary | ICD-10-CM | POA: Diagnosis not present

## 2020-05-06 DIAGNOSIS — Z043 Encounter for examination and observation following other accident: Secondary | ICD-10-CM | POA: Diagnosis not present

## 2020-05-06 DIAGNOSIS — I10 Essential (primary) hypertension: Secondary | ICD-10-CM | POA: Diagnosis not present

## 2020-05-06 DIAGNOSIS — R456 Violent behavior: Secondary | ICD-10-CM | POA: Diagnosis not present

## 2020-05-06 DIAGNOSIS — F0280 Dementia in other diseases classified elsewhere without behavioral disturbance: Secondary | ICD-10-CM | POA: Diagnosis not present

## 2020-05-06 DIAGNOSIS — S0181XA Laceration without foreign body of other part of head, initial encounter: Secondary | ICD-10-CM | POA: Diagnosis not present

## 2020-05-06 DIAGNOSIS — W19XXXA Unspecified fall, initial encounter: Secondary | ICD-10-CM | POA: Diagnosis not present

## 2020-05-06 DIAGNOSIS — Z79899 Other long term (current) drug therapy: Secondary | ICD-10-CM | POA: Diagnosis not present

## 2020-05-06 DIAGNOSIS — Z23 Encounter for immunization: Secondary | ICD-10-CM | POA: Diagnosis not present

## 2020-05-06 DIAGNOSIS — R7303 Prediabetes: Secondary | ICD-10-CM | POA: Diagnosis not present

## 2020-05-06 DIAGNOSIS — G3109 Other frontotemporal dementia: Secondary | ICD-10-CM | POA: Diagnosis not present

## 2020-05-12 DIAGNOSIS — S0990XA Unspecified injury of head, initial encounter: Secondary | ICD-10-CM | POA: Diagnosis not present

## 2020-05-12 DIAGNOSIS — Z743 Need for continuous supervision: Secondary | ICD-10-CM | POA: Diagnosis not present

## 2020-05-12 DIAGNOSIS — R7303 Prediabetes: Secondary | ICD-10-CM | POA: Diagnosis not present

## 2020-05-12 DIAGNOSIS — F0280 Dementia in other diseases classified elsewhere without behavioral disturbance: Secondary | ICD-10-CM | POA: Diagnosis not present

## 2020-05-12 DIAGNOSIS — R279 Unspecified lack of coordination: Secondary | ICD-10-CM | POA: Diagnosis not present

## 2020-05-12 DIAGNOSIS — M5031 Other cervical disc degeneration,  high cervical region: Secondary | ICD-10-CM | POA: Diagnosis not present

## 2020-05-12 DIAGNOSIS — Z981 Arthrodesis status: Secondary | ICD-10-CM | POA: Diagnosis not present

## 2020-05-12 DIAGNOSIS — I10 Essential (primary) hypertension: Secondary | ICD-10-CM | POA: Diagnosis not present

## 2020-05-12 DIAGNOSIS — G311 Senile degeneration of brain, not elsewhere classified: Secondary | ICD-10-CM | POA: Diagnosis not present

## 2020-05-12 DIAGNOSIS — Z79899 Other long term (current) drug therapy: Secondary | ICD-10-CM | POA: Diagnosis not present

## 2020-05-12 DIAGNOSIS — R5381 Other malaise: Secondary | ICD-10-CM | POA: Diagnosis not present

## 2020-05-12 DIAGNOSIS — R4689 Other symptoms and signs involving appearance and behavior: Secondary | ICD-10-CM | POA: Diagnosis not present

## 2020-05-12 DIAGNOSIS — R2681 Unsteadiness on feet: Secondary | ICD-10-CM | POA: Diagnosis not present

## 2020-05-12 DIAGNOSIS — W19XXXA Unspecified fall, initial encounter: Secondary | ICD-10-CM | POA: Diagnosis not present

## 2020-05-12 DIAGNOSIS — Z043 Encounter for examination and observation following other accident: Secondary | ICD-10-CM | POA: Diagnosis not present

## 2020-05-12 DIAGNOSIS — G3109 Other frontotemporal dementia: Secondary | ICD-10-CM | POA: Diagnosis not present

## 2020-05-12 DIAGNOSIS — Z7982 Long term (current) use of aspirin: Secondary | ICD-10-CM | POA: Diagnosis not present

## 2020-05-12 DIAGNOSIS — S01111A Laceration without foreign body of right eyelid and periocular area, initial encounter: Secondary | ICD-10-CM | POA: Diagnosis not present

## 2020-05-12 DIAGNOSIS — Z9841 Cataract extraction status, right eye: Secondary | ICD-10-CM | POA: Diagnosis not present

## 2020-05-12 DIAGNOSIS — R296 Repeated falls: Secondary | ICD-10-CM | POA: Diagnosis not present

## 2020-05-12 DIAGNOSIS — R9089 Other abnormal findings on diagnostic imaging of central nervous system: Secondary | ICD-10-CM | POA: Diagnosis not present

## 2020-05-12 DIAGNOSIS — S0191XA Laceration without foreign body of unspecified part of head, initial encounter: Secondary | ICD-10-CM | POA: Diagnosis not present

## 2020-05-17 DIAGNOSIS — F0391 Unspecified dementia with behavioral disturbance: Secondary | ICD-10-CM | POA: Diagnosis not present

## 2020-05-17 DIAGNOSIS — S0191XA Laceration without foreign body of unspecified part of head, initial encounter: Secondary | ICD-10-CM | POA: Diagnosis not present

## 2020-05-17 DIAGNOSIS — R4689 Other symptoms and signs involving appearance and behavior: Secondary | ICD-10-CM | POA: Diagnosis not present

## 2020-05-17 DIAGNOSIS — G3109 Other frontotemporal dementia: Secondary | ICD-10-CM | POA: Diagnosis not present

## 2020-05-17 DIAGNOSIS — R296 Repeated falls: Secondary | ICD-10-CM | POA: Diagnosis not present

## 2020-05-20 DIAGNOSIS — R296 Repeated falls: Secondary | ICD-10-CM | POA: Diagnosis not present

## 2020-05-20 DIAGNOSIS — S0191XA Laceration without foreign body of unspecified part of head, initial encounter: Secondary | ICD-10-CM | POA: Diagnosis not present

## 2020-05-20 DIAGNOSIS — F0280 Dementia in other diseases classified elsewhere without behavioral disturbance: Secondary | ICD-10-CM | POA: Diagnosis not present

## 2020-05-20 DIAGNOSIS — G3109 Other frontotemporal dementia: Secondary | ICD-10-CM | POA: Diagnosis not present

## 2020-05-20 DIAGNOSIS — G4700 Insomnia, unspecified: Secondary | ICD-10-CM | POA: Diagnosis not present

## 2020-05-20 DIAGNOSIS — R4689 Other symptoms and signs involving appearance and behavior: Secondary | ICD-10-CM | POA: Diagnosis not present

## 2020-05-20 DIAGNOSIS — F0391 Unspecified dementia with behavioral disturbance: Secondary | ICD-10-CM | POA: Diagnosis not present

## 2020-05-20 DIAGNOSIS — F39 Unspecified mood [affective] disorder: Secondary | ICD-10-CM | POA: Diagnosis not present

## 2020-05-24 DIAGNOSIS — R2681 Unsteadiness on feet: Secondary | ICD-10-CM | POA: Diagnosis not present

## 2020-05-24 DIAGNOSIS — R4689 Other symptoms and signs involving appearance and behavior: Secondary | ICD-10-CM | POA: Diagnosis not present

## 2020-05-24 DIAGNOSIS — F0391 Unspecified dementia with behavioral disturbance: Secondary | ICD-10-CM | POA: Diagnosis not present

## 2020-05-24 DIAGNOSIS — R296 Repeated falls: Secondary | ICD-10-CM | POA: Diagnosis not present

## 2020-05-31 DIAGNOSIS — S0990XA Unspecified injury of head, initial encounter: Secondary | ICD-10-CM | POA: Diagnosis not present

## 2020-05-31 DIAGNOSIS — R2681 Unsteadiness on feet: Secondary | ICD-10-CM | POA: Diagnosis not present

## 2020-05-31 DIAGNOSIS — F0391 Unspecified dementia with behavioral disturbance: Secondary | ICD-10-CM | POA: Diagnosis not present

## 2020-05-31 DIAGNOSIS — R296 Repeated falls: Secondary | ICD-10-CM | POA: Diagnosis not present

## 2020-06-01 DIAGNOSIS — M25551 Pain in right hip: Secondary | ICD-10-CM | POA: Diagnosis not present

## 2020-06-06 DIAGNOSIS — E119 Type 2 diabetes mellitus without complications: Secondary | ICD-10-CM | POA: Diagnosis not present

## 2020-06-06 DIAGNOSIS — S0083XA Contusion of other part of head, initial encounter: Secondary | ICD-10-CM | POA: Diagnosis not present

## 2020-06-06 DIAGNOSIS — Z043 Encounter for examination and observation following other accident: Secondary | ICD-10-CM | POA: Diagnosis not present

## 2020-06-06 DIAGNOSIS — G9341 Metabolic encephalopathy: Secondary | ICD-10-CM | POA: Diagnosis not present

## 2020-06-06 DIAGNOSIS — I619 Nontraumatic intracerebral hemorrhage, unspecified: Secondary | ICD-10-CM | POA: Diagnosis not present

## 2020-06-06 DIAGNOSIS — E870 Hyperosmolality and hypernatremia: Secondary | ICD-10-CM | POA: Diagnosis not present

## 2020-06-06 DIAGNOSIS — Z7982 Long term (current) use of aspirin: Secondary | ICD-10-CM | POA: Diagnosis not present

## 2020-06-06 DIAGNOSIS — I10 Essential (primary) hypertension: Secondary | ICD-10-CM | POA: Diagnosis not present

## 2020-06-06 DIAGNOSIS — M7989 Other specified soft tissue disorders: Secondary | ICD-10-CM | POA: Diagnosis not present

## 2020-06-06 DIAGNOSIS — S0993XA Unspecified injury of face, initial encounter: Secondary | ICD-10-CM | POA: Diagnosis not present

## 2020-06-06 DIAGNOSIS — R4182 Altered mental status, unspecified: Secondary | ICD-10-CM | POA: Diagnosis not present

## 2020-06-06 DIAGNOSIS — L89150 Pressure ulcer of sacral region, unstageable: Secondary | ICD-10-CM | POA: Diagnosis not present

## 2020-06-06 DIAGNOSIS — F0391 Unspecified dementia with behavioral disturbance: Secondary | ICD-10-CM | POA: Diagnosis not present

## 2020-06-06 DIAGNOSIS — Z79899 Other long term (current) drug therapy: Secondary | ICD-10-CM | POA: Diagnosis not present

## 2020-06-06 DIAGNOSIS — E860 Dehydration: Secondary | ICD-10-CM | POA: Diagnosis not present

## 2020-06-06 DIAGNOSIS — R1311 Dysphagia, oral phase: Secondary | ICD-10-CM | POA: Diagnosis not present

## 2020-06-06 DIAGNOSIS — Z9181 History of falling: Secondary | ICD-10-CM | POA: Diagnosis not present

## 2020-06-06 DIAGNOSIS — R410 Disorientation, unspecified: Secondary | ICD-10-CM | POA: Diagnosis not present

## 2020-06-06 DIAGNOSIS — S72002A Fracture of unspecified part of neck of left femur, initial encounter for closed fracture: Secondary | ICD-10-CM | POA: Diagnosis not present

## 2020-06-07 DIAGNOSIS — F0391 Unspecified dementia with behavioral disturbance: Secondary | ICD-10-CM | POA: Diagnosis not present

## 2020-06-07 DIAGNOSIS — E860 Dehydration: Secondary | ICD-10-CM | POA: Diagnosis not present

## 2020-06-07 DIAGNOSIS — G9341 Metabolic encephalopathy: Secondary | ICD-10-CM | POA: Diagnosis not present

## 2020-06-08 DIAGNOSIS — E870 Hyperosmolality and hypernatremia: Secondary | ICD-10-CM | POA: Diagnosis not present

## 2020-06-08 DIAGNOSIS — R410 Disorientation, unspecified: Secondary | ICD-10-CM | POA: Diagnosis not present

## 2020-06-08 DIAGNOSIS — E639 Nutritional deficiency, unspecified: Secondary | ICD-10-CM | POA: Diagnosis not present

## 2020-06-08 DIAGNOSIS — F0391 Unspecified dementia with behavioral disturbance: Secondary | ICD-10-CM | POA: Diagnosis not present

## 2020-06-08 DIAGNOSIS — G9341 Metabolic encephalopathy: Secondary | ICD-10-CM | POA: Diagnosis not present

## 2020-06-08 DIAGNOSIS — E860 Dehydration: Secondary | ICD-10-CM | POA: Diagnosis not present

## 2020-06-08 DIAGNOSIS — Z515 Encounter for palliative care: Secondary | ICD-10-CM | POA: Diagnosis not present

## 2020-06-09 DIAGNOSIS — F0391 Unspecified dementia with behavioral disturbance: Secondary | ICD-10-CM | POA: Diagnosis not present

## 2020-06-09 DIAGNOSIS — Z515 Encounter for palliative care: Secondary | ICD-10-CM | POA: Diagnosis not present

## 2020-06-09 DIAGNOSIS — G9341 Metabolic encephalopathy: Secondary | ICD-10-CM | POA: Diagnosis not present

## 2020-06-09 DIAGNOSIS — E870 Hyperosmolality and hypernatremia: Secondary | ICD-10-CM | POA: Diagnosis not present

## 2020-06-09 DIAGNOSIS — E860 Dehydration: Secondary | ICD-10-CM | POA: Diagnosis not present

## 2020-06-09 DIAGNOSIS — E43 Unspecified severe protein-calorie malnutrition: Secondary | ICD-10-CM | POA: Diagnosis not present

## 2020-06-09 DIAGNOSIS — Z7189 Other specified counseling: Secondary | ICD-10-CM | POA: Diagnosis not present

## 2020-06-10 DIAGNOSIS — R531 Weakness: Secondary | ICD-10-CM | POA: Diagnosis not present

## 2020-06-10 DIAGNOSIS — R2242 Localized swelling, mass and lump, left lower limb: Secondary | ICD-10-CM | POA: Diagnosis not present

## 2020-06-10 DIAGNOSIS — Z515 Encounter for palliative care: Secondary | ICD-10-CM | POA: Diagnosis not present

## 2020-06-10 DIAGNOSIS — L8990 Pressure ulcer of unspecified site, unspecified stage: Secondary | ICD-10-CM | POA: Diagnosis not present

## 2020-06-10 DIAGNOSIS — M2550 Pain in unspecified joint: Secondary | ICD-10-CM | POA: Diagnosis not present

## 2020-06-10 DIAGNOSIS — E860 Dehydration: Secondary | ICD-10-CM | POA: Diagnosis not present

## 2020-06-10 DIAGNOSIS — W19XXXD Unspecified fall, subsequent encounter: Secondary | ICD-10-CM | POA: Diagnosis not present

## 2020-06-10 DIAGNOSIS — I959 Hypotension, unspecified: Secondary | ICD-10-CM | POA: Diagnosis not present

## 2020-06-10 DIAGNOSIS — I10 Essential (primary) hypertension: Secondary | ICD-10-CM | POA: Diagnosis not present

## 2020-06-10 DIAGNOSIS — F0391 Unspecified dementia with behavioral disturbance: Secondary | ICD-10-CM | POA: Diagnosis not present

## 2020-06-10 DIAGNOSIS — Z7401 Bed confinement status: Secondary | ICD-10-CM | POA: Diagnosis not present

## 2020-06-10 DIAGNOSIS — E43 Unspecified severe protein-calorie malnutrition: Secondary | ICD-10-CM | POA: Diagnosis not present

## 2020-06-10 DIAGNOSIS — Z7189 Other specified counseling: Secondary | ICD-10-CM | POA: Diagnosis not present

## 2020-06-10 DIAGNOSIS — G9341 Metabolic encephalopathy: Secondary | ICD-10-CM | POA: Diagnosis not present

## 2020-06-10 DIAGNOSIS — E870 Hyperosmolality and hypernatremia: Secondary | ICD-10-CM | POA: Diagnosis not present

## 2020-06-24 DEATH — deceased

## 2021-10-20 ENCOUNTER — Other Ambulatory Visit: Payer: Self-pay | Admitting: *Deleted

## 2022-09-06 ENCOUNTER — Other Ambulatory Visit (HOSPITAL_COMMUNITY): Payer: Self-pay
